# Patient Record
Sex: Female | Born: 1945 | Race: White | Hispanic: No | Smoking: Never smoker
Health system: Southern US, Community
[De-identification: ages and names within clinical notes are randomized; demographics above are authoritative.]

## PROBLEM LIST (undated history)

## (undated) DIAGNOSIS — G809 Cerebral palsy, unspecified: Secondary | ICD-10-CM

---

## 1997-08-28 ENCOUNTER — Other Ambulatory Visit: Admission: RE | Admit: 1997-08-28 | Discharge: 1997-08-28 | Payer: Self-pay | Admitting: Obstetrics and Gynecology

## 2001-03-10 ENCOUNTER — Other Ambulatory Visit: Admission: RE | Admit: 2001-03-10 | Discharge: 2001-03-10 | Payer: Self-pay | Admitting: Obstetrics and Gynecology

## 2004-01-21 ENCOUNTER — Other Ambulatory Visit: Admission: RE | Admit: 2004-01-21 | Discharge: 2004-01-21 | Payer: Self-pay | Admitting: Obstetrics and Gynecology

## 2004-01-24 ENCOUNTER — Ambulatory Visit (HOSPITAL_COMMUNITY): Admission: RE | Admit: 2004-01-24 | Discharge: 2004-01-24 | Payer: Self-pay | Admitting: Obstetrics and Gynecology

## 2004-04-09 ENCOUNTER — Ambulatory Visit (HOSPITAL_COMMUNITY): Admission: RE | Admit: 2004-04-09 | Discharge: 2004-04-09 | Payer: Self-pay | Admitting: Gastroenterology

## 2004-04-09 ENCOUNTER — Encounter (INDEPENDENT_AMBULATORY_CARE_PROVIDER_SITE_OTHER): Payer: Self-pay | Admitting: *Deleted

## 2005-02-04 ENCOUNTER — Ambulatory Visit (HOSPITAL_COMMUNITY): Admission: RE | Admit: 2005-02-04 | Discharge: 2005-02-04 | Payer: Self-pay | Admitting: Obstetrics and Gynecology

## 2007-11-23 ENCOUNTER — Ambulatory Visit (HOSPITAL_COMMUNITY): Admission: RE | Admit: 2007-11-23 | Discharge: 2007-11-23 | Payer: Self-pay | Admitting: Internal Medicine

## 2008-11-23 ENCOUNTER — Ambulatory Visit (HOSPITAL_COMMUNITY): Admission: RE | Admit: 2008-11-23 | Discharge: 2008-11-23 | Payer: Self-pay | Admitting: Internal Medicine

## 2010-10-17 NOTE — Op Note (Signed)
Abigail Thomas, BUFKIN              ACCOUNT NO.:  [id number]   MEDICAL RECORD NO.:  [id number]          PATIENT TYPE:  AMB   LOCATION:  ENDO                         FACILITY:  [facility]   PHYSICIAN:  Graylin Shiver, M.D.   DATE OF BIRTH:  08/17/1945   DATE OF PROCEDURE:  04/09/2004  DATE OF DISCHARGE:                                 OPERATIVE REPORT   PROCEDURE PERFORMED:  Colonoscopy with biopsy.   INDICATIONS FOR PROCEDURE:  Diarrhea.   Informed consent was obtained after explanation of the risks of bleeding,  infection, and perforation.   PREMEDICATIONS:  Fentanyl 100 mcg  IV, Versed 10 mg IV.   DESCRIPTION OF PROCEDURE:  With the patient in the left lateral decubitus  position, a rectal exam was performed and no masses were felt.  The Olympus  colonoscope was inserted into the rectum and advanced around the colon to  the cecum.  Cecal landmarks were identified.  The cecum and ascending colon  were normal.  Some random biopsies were obtained from the right colon to  look for any evidence of collagenous colitis.  The transverse colon normal.  The descending colon, sigmoid and rectum were normal.  The patient tolerated  the procedure well without complications.   IMPRESSION:  Normal colonoscopy to the cecum.   PLAN:  I would recommend that the patient take a fiber supplement such as  Metamucil.  The biopsies will be checked to look for any evidence of  collagenous colitis.       SFG/MEDQ  D:  04/09/2004  T:  04/09/2004  Job:  [id number]   cc:   Fleet Contras, M.D.  [address]  [place]  [place] [address]  Fax: [phone]

## 2013-09-24 ENCOUNTER — Emergency Department (INDEPENDENT_AMBULATORY_CARE_PROVIDER_SITE_OTHER)
Admission: EM | Admit: 2013-09-24 | Discharge: 2013-09-24 | Disposition: A | Discharge status: Home / Self Care | Payer: PRIVATE HEALTH INSURANCE | Source: Home / Self Care | Attending: Family Medicine | Admitting: Family Medicine

## 2013-09-24 ENCOUNTER — Encounter (HOSPITAL_COMMUNITY): Payer: Self-pay | Admitting: Emergency Medicine

## 2013-09-24 DIAGNOSIS — M5430 Sciatica, unspecified side: Secondary | ICD-10-CM

## 2013-09-24 DIAGNOSIS — R339 Retention of urine, unspecified: Secondary | ICD-10-CM

## 2013-09-24 LAB — POCT URINALYSIS DIP (DEVICE)
Bilirubin Urine: NEGATIVE
GLUCOSE, UA: NEGATIVE mg/dL
Hgb urine dipstick: NEGATIVE
KETONES UR: NEGATIVE mg/dL
LEUKOCYTES UA: NEGATIVE
NITRITE: NEGATIVE
PH: 7 (ref 5.0–8.0)
PROTEIN: NEGATIVE mg/dL
Specific Gravity, Urine: 1.015 (ref 1.005–1.030)
UROBILINOGEN UA: 0.2 mg/dL (ref 0.0–1.0)

## 2013-09-24 LAB — POCT I-STAT, CHEM 8
BUN: 30 mg/dL — AB (ref 6–23)
CALCIUM ION: 1.16 mmol/L (ref 1.13–1.30)
CREATININE: 0.7 mg/dL (ref 0.50–1.10)
Chloride: 103 mEq/L (ref 96–112)
GLUCOSE: 116 mg/dL — AB (ref 70–99)
HEMATOCRIT: 41 % (ref 36.0–46.0)
Hemoglobin: 13.9 g/dL (ref 12.0–15.0)
POTASSIUM: 3.9 meq/L (ref 3.7–5.3)
SODIUM: 140 meq/L (ref 137–147)
TCO2: 25 mmol/L (ref 0–100)

## 2013-09-24 LAB — CBC
HCT: 39.2 % (ref 36.0–46.0)
Hemoglobin: 13.4 g/dL (ref 12.0–15.0)
MCH: 32.1 pg (ref 26.0–34.0)
MCHC: 34.2 g/dL (ref 30.0–36.0)
MCV: 94 fL (ref 78.0–100.0)
PLATELETS: 182 10*3/uL (ref 150–400)
RBC: 4.17 MIL/uL (ref 3.87–5.11)
RDW: 12.9 % (ref 11.5–15.5)
WBC: 7.4 10*3/uL (ref 4.0–10.5)

## 2013-09-24 MED ORDER — PREDNISONE 5 MG PO KIT: PACK | ORAL | Status: DC

## 2013-09-24 MED ORDER — TRAMADOL HCL 50 MG PO TABS: 50.0000 mg | ORAL_TABLET | Freq: Four times a day (QID) | ORAL | Status: DC | PRN

## 2013-09-24 NOTE — Discharge Instructions (Signed)
Thank you for coming in today. Follow up with Dr. August Saucerean at [facility] for your leg and back soon.  Take prednisone daily.  Use tramadol for pain.  Come back or go to the emergency room if you notice new weakness new numbness problems walking or bowel or bladder problems.  Sciatica Sciatica is pain, weakness, numbness, or tingling along the path of the sciatic nerve. The nerve starts in the lower back and runs down the back of each leg. The nerve controls the muscles in the lower leg and in the back of the knee, while also providing sensation to the back of the thigh, lower leg, and the sole of your foot. Sciatica is a symptom of another medical condition. For instance, nerve damage or certain conditions, such as a herniated disk or bone spur on the spine, pinch or put pressure on the sciatic nerve. This causes the pain, weakness, or other sensations normally associated with sciatica. Generally, sciatica only affects one side of the body. CAUSES   Herniated or slipped disc.  Degenerative disk disease.  A pain disorder involving the narrow muscle in the buttocks (piriformis syndrome).  Pelvic injury or fracture.  Pregnancy.  Tumor (rare). SYMPTOMS  Symptoms can vary from mild to very severe. The symptoms usually travel from the low back to the buttocks and down the back of the leg. Symptoms can include:  Mild tingling or dull aches in the lower back, leg, or hip.  Numbness in the back of the calf or sole of the foot.  Burning sensations in the lower back, leg, or hip.  Sharp pains in the lower back, leg, or hip.  Leg weakness.  Severe back pain inhibiting movement. These symptoms may get worse with coughing, sneezing, laughing, or prolonged sitting or standing. Also, being overweight may worsen symptoms. DIAGNOSIS  Your caregiver will perform a physical exam to look for common symptoms of sciatica. He or she may ask you to do certain movements or activities that would  trigger sciatic nerve pain. Other tests may be performed to find the cause of the sciatica. These may include:  Blood tests.  X-rays.  Imaging tests, such as an MRI or CT scan. TREATMENT  Treatment is directed at the cause of the sciatic pain. Sometimes, treatment is not necessary and the pain and discomfort goes away on its own. If treatment is needed, your caregiver may suggest:  Over-the-counter medicines to relieve pain.  Prescription medicines, such as anti-inflammatory medicine, muscle relaxants, or narcotics.  Applying heat or ice to the painful area.  Steroid injections to lessen pain, irritation, and inflammation around the nerve.  Reducing activity during periods of pain.  Exercising and stretching to strengthen your abdomen and improve flexibility of your spine. Your caregiver may suggest losing weight if the extra weight makes the back pain worse.  Physical therapy.  Surgery to eliminate what is pressing or pinching the nerve, such as a bone spur or part of a herniated disk. HOME CARE INSTRUCTIONS   Only take over-the-counter or prescription medicines for pain or discomfort as directed by your caregiver.  Apply ice to the affected area for 20 minutes, 3 4 times a day for the first 48 72 hours. Then try heat in the same way.  Exercise, stretch, or perform your usual activities if these do not aggravate your pain.  Attend physical therapy sessions as directed by your caregiver.  Keep all follow-up appointments as directed by your caregiver.  Do not wear high heels  or shoes that do not provide proper support.  Check your mattress to see if it is too soft. A firm mattress may lessen your pain and discomfort. SEEK IMMEDIATE MEDICAL CARE IF:   You lose control of your bowel or bladder (incontinence).  You have increasing weakness in the lower back, pelvis, buttocks, or legs.  You have redness or swelling of your back.  You have a burning sensation when you  urinate.  You have pain that gets worse when you lie down or awakens you at night.  Your pain is worse than you have experienced in the past.  Your pain is lasting longer than 4 weeks.  You are suddenly losing weight without reason. MAKE SURE YOU:  Understand these instructions.  Will watch your condition.  Will get help right away if you are not doing well or get worse. Document Released: 05/12/2001 Document Revised: 11/17/2011 Document Reviewed: 09/27/2011 [facility] Patient Information 2014 [place], [place].  Acute Urinary Retention Acute urinary retention is the temporary inability to urinate. This is an uncommon problem in women. It can be caused by:  Infection.  A side effect of a medicine.  A problem in a nearby organ that presses or squeezes on the bladder or the urethra (the tube that drains the bladder).  Psychological problems.   Surgery on your bladder, urethra, or pelvic organs that causes obstruction to the outflow of urine from your bladder. HOME CARE INSTRUCTIONS  If you are sent home with a Foley catheter and a drainage system, you will need to discuss the best course of action with your health care provider. While the catheter is in, maintain a good intake of fluids. Keep the drainage bag emptied and lower than your catheter. This is so that contaminated urine will not flow back into your bladder, which could lead to a urinary tract infection. There are two main types of drainage bags. One is a large bag that usually is used at night. It has a good capacity that will allow you to sleep through the night without having to empty it. The second type is called a leg bag. It has a smaller capacity so it needs to be emptied more frequently. However, the main advantage is that it can be attached by a leg strap and goes underneath your clothing, allowing you the freedom to move about or leave your home. Only take over-the-counter or prescription medicines for pain,  discomfort, or fever as directed by your health care provider.  SEEK MEDICAL CARE IF:  You develop a low-grade fever.  You experience spasms or leakage of urine with the spasms. SEEK IMMEDIATE MEDICAL CARE IF:   You develop chills or fever.  Your catheter stops draining urine.  Your catheter falls out.  You start to develop increased bleeding that does not respond to rest and increased fluid intake. MAKE SURE YOU:  Understand these instructions.  Will watch your condition.  Will get help right away if you are not doing well or get worse. Document Released: 05/17/2006 Document Revised: 03/08/2013 Document Reviewed: 10/27/2012 [facility] Patient Information 2014 [place], [place].

## 2013-09-24 NOTE — ED Provider Notes (Signed)
Abigail Thomas is a 68 y.o. female who presents to Urgent Care today for right back pain radiating to the right leg. The pain also radiates to the suprapubic area. Symptoms have been present now for one month. She denies any new weakness or numbness. She does note some difficulty urinating over the past month. She denies any fevers or chills nausea vomiting or diarrhea. She's tried Aleve which helps some. She has not seen a doctor for this problem yet. She denies any injury. She is a pertinent medical history for cerebral palsy results and lower extremity weakness bilaterally.   History reviewed. No pertinent past medical history. History  Substance Use Topics  . Smoking status: Not on file  . Smokeless tobacco: Not on file  . Alcohol Use: Not on file   ROS as above Medications: No current facility-administered medications for this encounter.   No current outpatient prescriptions on file.    Exam:  BP 147/84  Pulse 69  Temp(Src) 98 F (36.7 C) (Oral)  Resp 18  SpO2 100% Gen: Well NAD HEENT: EOMI,  MMM Lungs: Normal work of breathing. CTABL Heart: RRR no MRG Abd: NABS, Soft. NT, ND Exts: Brisk capillary refill, warm and well perfused.  Back: Nontender to spinal midline. Tender palpation right lumbar paraspinal area. Positive right straight leg raise test Neuro: Alert and oriented. Sensation is intact. Hyperreflexia bilateral knees. Unable to assess ankles. Lower extremity strength is decreased bilaterally however this is not new. Skin: No bedsores  A Foley catheter was placed. 300 mL of urine was drained. Patient felt mildly improved.  Results for orders placed during the hospital encounter of 09/24/13 (from the past 24 hour(s))  CBC     Status: None   Collection Time    09/24/13 12:09 PM      Result Value Ref Range   WBC 7.4  4.0 - 10.5 K/uL   RBC 4.17  3.87 - 5.11 MIL/uL   Hemoglobin 13.4  12.0 - 15.0 g/dL   HCT 16.139.2  09.636.0 - 04.546.0 %   MCV 94.0  78.0 - 100.0 fL   MCH 32.1   26.0 - 34.0 pg   MCHC 34.2  30.0 - 36.0 g/dL   RDW 40.912.9  81.111.5 - 91.415.5 %   Platelets 182  150 - 400 K/uL  POCT URINALYSIS DIP (DEVICE)     Status: None   Collection Time    09/24/13 12:09 PM      Result Value Ref Range   Glucose, UA NEGATIVE  NEGATIVE mg/dL   Bilirubin Urine NEGATIVE  NEGATIVE   Ketones, ur NEGATIVE  NEGATIVE mg/dL   Specific Gravity, Urine 1.015  1.005 - 1.030   Hgb urine dipstick NEGATIVE  NEGATIVE   pH 7.0  5.0 - 8.0   Protein, ur NEGATIVE  NEGATIVE mg/dL   Urobilinogen, UA 0.2  0.0 - 1.0 mg/dL   Nitrite NEGATIVE  NEGATIVE   Leukocytes, UA NEGATIVE  NEGATIVE  POCT I-STAT, CHEM 8     Status: Abnormal   Collection Time    09/24/13 12:27 PM      Result Value Ref Range   Sodium 140  137 - 147 mEq/L   Potassium 3.9  3.7 - 5.3 mEq/L   Chloride 103  96 - 112 mEq/L   BUN 30 (*) 6 - 23 mg/dL   Creatinine, Ser 7.820.70  0.50 - 1.10 mg/dL   Glucose, Bld 956116 (*) 70 - 99 mg/dL   Calcium, Ion 2.131.16  0.861.13 -  1.30 mmol/L   TCO2 25  0 - 100 mmol/L   Hemoglobin 13.9  12.0 - 15.0 g/dL   HCT 40.941.0  81.136.0 - 91.446.0 %   No results found.  Assessment and Plan: 68 y.o. female with right leg pain associated with urinary retention.  I am concerned for lumbar radiculopathy is a major component of her symptoms. Her neurologic exam is somewhat difficult given her history of cerebral palsy. She does not have significant urinary retention currently. His symptoms have been ongoing now for one month. Plan to use prednisone Dosepak to combat sciatica symptoms. Additionally his tramadol for pain control. Followup with orthopedics soon for evaluation and management.  Discussed warning signs or symptoms. Please see discharge instructions. Patient expresses understanding.    Rodolph BongEvan S Hoang Pettingill, MD 09/24/13 [phone]

## 2013-09-24 NOTE — ED Notes (Signed)
Patient states has been having right sided flank pain Pain shoots down her right leg Not able to get urine spicimen

## 2013-09-25 LAB — URINE CULTURE
CULTURE: NO GROWTH
Colony Count: NO GROWTH

## 2014-01-18 ENCOUNTER — Encounter (HOSPITAL_COMMUNITY): Payer: Self-pay | Admitting: Emergency Medicine

## 2014-01-18 ENCOUNTER — Emergency Department (HOSPITAL_COMMUNITY)
Admission: EM | Admit: 2014-01-18 | Discharge: 2014-01-18 | Discharge status: Against Medical Advice | Payer: PRIVATE HEALTH INSURANCE | Attending: Emergency Medicine | Admitting: Emergency Medicine

## 2014-01-18 ENCOUNTER — Emergency Department (INDEPENDENT_AMBULATORY_CARE_PROVIDER_SITE_OTHER)
Admission: EM | Admit: 2014-01-18 | Discharge: 2014-01-18 | Disposition: A | Discharge status: Nursing Home (Includes ICF) | Payer: PRIVATE HEALTH INSURANCE | Source: Home / Self Care | Attending: Emergency Medicine | Admitting: Emergency Medicine

## 2014-01-18 DIAGNOSIS — W19XXXA Unspecified fall, initial encounter: Secondary | ICD-10-CM | POA: Diagnosis not present

## 2014-01-18 DIAGNOSIS — S298XXA Other specified injuries of thorax, initial encounter: Secondary | ICD-10-CM | POA: Insufficient documentation

## 2014-01-18 DIAGNOSIS — S99929A Unspecified injury of unspecified foot, initial encounter: Secondary | ICD-10-CM

## 2014-01-18 DIAGNOSIS — Y92009 Unspecified place in unspecified non-institutional (private) residence as the place of occurrence of the external cause: Secondary | ICD-10-CM

## 2014-01-18 DIAGNOSIS — S0990XA Unspecified injury of head, initial encounter: Secondary | ICD-10-CM | POA: Diagnosis present

## 2014-01-18 DIAGNOSIS — S060X0A Concussion without loss of consciousness, initial encounter: Secondary | ICD-10-CM

## 2014-01-18 DIAGNOSIS — Y929 Unspecified place or not applicable: Secondary | ICD-10-CM | POA: Diagnosis not present

## 2014-01-18 DIAGNOSIS — S0993XA Unspecified injury of face, initial encounter: Secondary | ICD-10-CM | POA: Diagnosis not present

## 2014-01-18 DIAGNOSIS — Y939 Activity, unspecified: Secondary | ICD-10-CM | POA: Insufficient documentation

## 2014-01-18 DIAGNOSIS — W010XXA Fall on same level from slipping, tripping and stumbling without subsequent striking against object, initial encounter: Secondary | ICD-10-CM

## 2014-01-18 DIAGNOSIS — S8990XA Unspecified injury of unspecified lower leg, initial encounter: Secondary | ICD-10-CM | POA: Insufficient documentation

## 2014-01-18 DIAGNOSIS — S99919A Unspecified injury of unspecified ankle, initial encounter: Secondary | ICD-10-CM

## 2014-01-18 DIAGNOSIS — T07XXXA Unspecified multiple injuries, initial encounter: Secondary | ICD-10-CM

## 2014-01-18 DIAGNOSIS — S199XXA Unspecified injury of neck, initial encounter: Secondary | ICD-10-CM

## 2014-01-18 HISTORY — DX: Cerebral palsy, unspecified: G80.9

## 2014-01-18 NOTE — ED Provider Notes (Signed)
Chief Complaint   Chief Complaint  Patient presents with  . Fall    History of Present Illness   Abigail Thomas is a 68-year-old female with cerebral palsy who fell last night at home. She gotten up to the bathroom when she tripped over some braces that she has on her legs. She ended up on the floor. Her son was in the next room and he heard the fall and came running right away. There was no apparent loss of consciousness. She was able to get back up and then went back to bed. This morning she has no memory of the fall and she's been confused and disoriented. She denies any headache or facial pain. She hasn't had any neck pain. She has bruises on her arms, legs, and back as well as a large bruise on her occipital area. These are minimally tender to palpation. She's had no nausea or vomiting. No focal weakness or paresthesias. No difficulty with speech or ambulation. She's able to move all her extremities well. She denies any pain in her chest, abdomen, or back.  Review of Systems   Other than as noted above, the patient denies any of the following symptoms: ENT:  No headache, facial pain, or bleeding from the nose or ears.  No loose or broken teeth. Neck:  No neck pain or stiffnes. Cardiac:  No chest pain. No palpitations, dizziness, syncope or fainting. GI:  No abdominal pain. No nausea, vomiting, or diarrhea. M-S:  No extremity pain, swelling, bruising, limited ROM, or back pain. Neuro:  No loss of consciousness, seizure activity, dizziness, vertigo, paresthesias, numbness, or weakness.  No difficulty with speech or ambulation.  PMFSH   Past medical history, family history, social history, meds, and allergies were reviewed.    Physical Examination    Vital signs:  BP 120/93  Pulse 86  Temp(Src) 97.6 F (36.4 C) (Oral)  Resp 18  SpO2 97% General:  Alert, oriented and in no distress. Eye:  PERRL, full EOMs. ENT:  There is a large bruise on the occipital area. This is mildly  tender. Neck:  No tenderness to palpation.  Full ROM without pain. Heart:  Regular rhythm.  No extrasystoles, gallops, or murmers. Lungs:  No chest wall tenderness to palpation. Breath sounds clear and equal bilaterally.  No wheezes, rales or rhonchi. Abdomen:  Non tender. Back:  Non tender to palpation.  Full ROM without pain. Extremities:  No tenderness, swelling, or deformity.  Full ROM of all joints without pain.  Pulses full.  Brisk capillary refill. Neuro:  Alert and oriented times 3.  Cranial nerves intact.  No muscle weakness.  Sensation intact to light touch.  Gait normal. Skin:  She has extensive bruises on her arms, legs, and back, no abrasions or lacerations.  Assessment   The primary encounter diagnosis was Concussion, without loss of consciousness, initial encounter. Diagnoses of Multiple bruises, Fall at home, initial encounter, and Place of occurrence, home were also pertinent to this visit.  Given her 68 years old and her history of what appears to be concussion, I think it's best that she get imaging to rule out any intracranial bleed.  Plan   The patient was transferred to the ED via shuttle in stable condition.  Medical Decision Making:  68 year old with CP fell last night getting up to go to bathroom.  She hit her head and has multiple bruises all over.  Son states she been confused and disoriented since fall and does not have  good memory of event.  No LOC.  On exam today she is alert and oriented times 3.  She has a large tender bruise in her occipital area and multiple other bruises on arms, legs, and back.  No neuro signs or symptoms.  Given her age and symptoms of concussion I feel she needs a cranial CT.      Reuben Likes, MD 01/18/14 [phone]

## 2014-01-18 NOTE — ED Notes (Signed)
[facility]. Pt has head pain, neck pain and knot to head. sts was confused shortly after fall but clear now. sts also some leg pain. Pt has CP and was tangled up in bed sheets when she fell.

## 2014-01-18 NOTE — Discharge Instructions (Signed)
We have determined that your problem requires further evaluation in the emergency department.  We will take care of your transport there.  Once at the emergency department, you will be evaluated by a provider and they will order whatever treatment or tests they deem necessary.  We cannot guarantee that they will do any specific test or do any specific treatment.    Concussion A concussion is a brain injury. It is caused by:  A hit to the head.  A quick and sudden movement (jolt) of the head or neck. A concussion is usually not life threatening. Even so, it can cause serious problems. If you had a concussion before, you may have concussion-like problems after a hit to your head. HOME CARE General Instructions  Follow your doctor's directions carefully.  Take medicines only as told by your doctor.  Only take medicines your doctor says are safe.  Do not drink alcohol until your doctor says it is okay. Alcohol and some drugs can slow down healing. They can also put you at risk for further injury.  If you are having trouble remembering things, write them down.  Try to do one thing at a time if you get distracted easily. For example, do not watch TV while making dinner.  Talk to your family members or close friends when making important decisions.  Follow up with your doctor as told.  Watch your symptoms. Tell others to do the same. Serious problems can sometimes happen after a concussion. Older adults are more likely to have these problems.  Tell your teachers, school nurse, school counselor, coach, Event organiserathletic trainer, or work Production designer, theatre/television/filmmanager about your concussion. Tell them about what you can or cannot do. They should watch to see if:  It gets even harder for you to pay attention or concentrate.  It gets even harder for you to remember things or learn new things.  You need more time than normal to finish things.  You become annoyed (irritable) more than before.  You are not able to deal  with stress as well.  You have more problems than before.  Rest. Make sure you:  Get plenty of sleep at night.  Go to sleep early.  Go to bed at the same time every day. Try to wake up at the same time.  Rest during the day.  Take naps when you feel tired.  Limit activities where you have to think a lot or concentrate. These include:  Doing homework.  Doing work related to a job.  Watching TV.  Using the computer. Returning To Your Regular Activities Return to your normal activities slowly, not all at once. You must give your body and brain enough time to heal.   Do not play sports or do other athletic activities until your doctor says it is okay.  Ask your doctor when you can drive, ride a bicycle, or work other vehicles or machines. Never do these things if you feel dizzy.  Ask your doctor about when you can return to work or school. Preventing Another Concussion It is very important to avoid another brain injury, especially before you have healed. In rare cases, another injury can lead to permanent brain damage, brain swelling, or death. The risk of this is greatest during the first 7-10 days after your injury. Avoid injuries by:   Wearing a seat belt when riding in a car.  Not drinking too much alcohol.  Avoiding activities that could lead to a second concussion (such as contact sports).  Wearing a helmet when doing activities like:  Biking.  Skiing.  Skateboarding.  Skating.  Making your home safer by:  Removing things from the floor or stairways that could make you trip.  Using grab bars in bathrooms and handrails by stairs.  Placing non-slip mats on floors and in bathtubs.  Improve lighting in dark areas. GET HELP IF:  It gets even harder for you to pay attention or concentrate.  It gets even harder for you to remember things or learn new things.  You need more time than normal to finish things.  You become annoyed (irritable) more than  before.  You are not able to deal with stress as well.  You have more problems than before.  You have problems keeping your balance.  You are not able to react quickly when you should. Get help if you have any of these problems for more than 2 weeks:   Lasting (chronic) headaches.  Dizziness or trouble balancing.  Feeling sick to your stomach (nausea).  Seeing (vision) problems.  Being affected by noises or light more than normal.  Feeling sad, low, down in the dumps, blue, gloomy, or empty (depressed).  Mood changes (mood swings).  Feeling of fear or nervousness about what may happen (anxiety).  Feeling annoyed.  Memory problems.  Problems concentrating or paying attention.  Sleep problems.  Feeling tired all the time. GET HELP RIGHT AWAY IF:   You have bad headaches or your headaches get worse.  You have weakness (even if it is in one hand, leg, or part of the face).  You have loss of feeling (numbness).  You feel off balance.  You keep throwing up (vomiting).  You feel tired.  One black center of your eye (pupil) is larger than the other.  You twitch or shake violently (convulse).  Your speech is not clear (slurred).  You are more confused, easily angered (agitated), or annoyed than before.  You have more trouble resting than before.  You are unable to recognize people or places.  You have neck pain.  It is difficult to wake you up.  You have unusual behavior changes.  You pass out (lose consciousness). MAKE SURE YOU:   Understand these instructions.  Will watch your condition.  Will get help right away if you are not doing well or get worse. Document Released: 05/06/2009 Document Revised: 10/02/2013 Document Reviewed: 12/08/2012 [facility] Patient Information 2015 [place], [place]. This information is not intended to replace advice given to you by your health care provider. Make sure you discuss any questions you have with your health care  provider.

## 2014-01-18 NOTE — ED Notes (Signed)
Reports tripping and falling around 4 a.m this morning.  Pt is c/o  Pain in right knee that radiates up to hip.  Swelling in left foot.  Knot on back of head from hitting head.  Pt's  Son states "unsure if loss of consciousness states pt does not remember falling".    Son states "she seems disoriented".

## 2015-03-15 ENCOUNTER — Encounter (HOSPITAL_COMMUNITY): Payer: Self-pay | Admitting: Emergency Medicine

## 2015-03-15 ENCOUNTER — Inpatient Hospital Stay (HOSPITAL_COMMUNITY)
Admission: EM | Admit: 2015-03-15 | Discharge: 2015-03-18 | DRG: 556 | Disposition: A | Discharge status: Home / Self Care | Payer: Medicare Other | Attending: Family Medicine | Admitting: Family Medicine

## 2015-03-15 ENCOUNTER — Emergency Department (HOSPITAL_COMMUNITY): Payer: Medicare Other

## 2015-03-15 DIAGNOSIS — W19XXXA Unspecified fall, initial encounter: Secondary | ICD-10-CM | POA: Diagnosis present

## 2015-03-15 DIAGNOSIS — M25562 Pain in left knee: Secondary | ICD-10-CM | POA: Diagnosis present

## 2015-03-15 DIAGNOSIS — R531 Weakness: Secondary | ICD-10-CM | POA: Diagnosis not present

## 2015-03-15 DIAGNOSIS — M1711 Unilateral primary osteoarthritis, right knee: Secondary | ICD-10-CM | POA: Diagnosis not present

## 2015-03-15 DIAGNOSIS — E46 Unspecified protein-calorie malnutrition: Secondary | ICD-10-CM | POA: Diagnosis present

## 2015-03-15 DIAGNOSIS — Z23 Encounter for immunization: Secondary | ICD-10-CM | POA: Diagnosis not present

## 2015-03-15 DIAGNOSIS — R51 Headache: Secondary | ICD-10-CM | POA: Diagnosis present

## 2015-03-15 DIAGNOSIS — M25561 Pain in right knee: Secondary | ICD-10-CM | POA: Diagnosis not present

## 2015-03-15 DIAGNOSIS — M25569 Pain in unspecified knee: Secondary | ICD-10-CM | POA: Insufficient documentation

## 2015-03-15 DIAGNOSIS — N12 Tubulo-interstitial nephritis, not specified as acute or chronic: Secondary | ICD-10-CM | POA: Diagnosis present

## 2015-03-15 DIAGNOSIS — N390 Urinary tract infection, site not specified: Secondary | ICD-10-CM | POA: Diagnosis not present

## 2015-03-15 DIAGNOSIS — R519 Headache, unspecified: Secondary | ICD-10-CM

## 2015-03-15 DIAGNOSIS — I6789 Other cerebrovascular disease: Secondary | ICD-10-CM | POA: Diagnosis not present

## 2015-03-15 DIAGNOSIS — Q660 Congenital talipes equinovarus: Secondary | ICD-10-CM

## 2015-03-15 DIAGNOSIS — R200 Anesthesia of skin: Secondary | ICD-10-CM | POA: Diagnosis not present

## 2015-03-15 DIAGNOSIS — M1712 Unilateral primary osteoarthritis, left knee: Secondary | ICD-10-CM | POA: Diagnosis not present

## 2015-03-15 DIAGNOSIS — G809 Cerebral palsy, unspecified: Secondary | ICD-10-CM | POA: Diagnosis present

## 2015-03-15 DIAGNOSIS — B888 Other specified infestations: Secondary | ICD-10-CM | POA: Diagnosis not present

## 2015-03-15 DIAGNOSIS — Y92009 Unspecified place in unspecified non-institutional (private) residence as the place of occurrence of the external cause: Secondary | ICD-10-CM

## 2015-03-15 DIAGNOSIS — M542 Cervicalgia: Secondary | ICD-10-CM | POA: Diagnosis not present

## 2015-03-15 LAB — I-STAT CHEM 8, ED
BUN: 35 mg/dL — ABNORMAL HIGH (ref 6–20)
CHLORIDE: 105 mmol/L (ref 101–111)
CREATININE: 0.8 mg/dL (ref 0.44–1.00)
Calcium, Ion: 1.17 mmol/L (ref 1.13–1.30)
Glucose, Bld: 136 mg/dL — ABNORMAL HIGH (ref 65–99)
HEMATOCRIT: 43 % (ref 36.0–46.0)
HEMOGLOBIN: 14.6 g/dL (ref 12.0–15.0)
POTASSIUM: 4.3 mmol/L (ref 3.5–5.1)
Sodium: 140 mmol/L (ref 135–145)
TCO2: 25 mmol/L (ref 0–100)

## 2015-03-15 LAB — URINALYSIS, ROUTINE W REFLEX MICROSCOPIC
GLUCOSE, UA: NEGATIVE mg/dL
KETONES UR: NEGATIVE mg/dL
Nitrite: POSITIVE — AB
PH: 5 (ref 5.0–8.0)
Protein, ur: NEGATIVE mg/dL
Specific Gravity, Urine: 1.026 (ref 1.005–1.030)
Urobilinogen, UA: 0.2 mg/dL (ref 0.0–1.0)

## 2015-03-15 LAB — DIFFERENTIAL
BASOS ABS: 0 10*3/uL (ref 0.0–0.1)
BASOS PCT: 1 %
EOS ABS: 0.2 10*3/uL (ref 0.0–0.7)
Eosinophils Relative: 3 %
LYMPHS ABS: 1.2 10*3/uL (ref 0.7–4.0)
Lymphocytes Relative: 16 %
Monocytes Absolute: 0.5 10*3/uL (ref 0.1–1.0)
Monocytes Relative: 7 %
NEUTROS ABS: 5.6 10*3/uL (ref 1.7–7.7)
NEUTROS PCT: 73 %

## 2015-03-15 LAB — APTT: APTT: 33 s (ref 24–37)

## 2015-03-15 LAB — COMPREHENSIVE METABOLIC PANEL
ALBUMIN: 3.7 g/dL (ref 3.5–5.0)
ALT: 15 U/L (ref 14–54)
AST: 24 U/L (ref 15–41)
Alkaline Phosphatase: 73 U/L (ref 38–126)
Anion gap: 10 (ref 5–15)
BUN: 29 mg/dL — AB (ref 6–20)
CHLORIDE: 105 mmol/L (ref 101–111)
CO2: 26 mmol/L (ref 22–32)
Calcium: 9.2 mg/dL (ref 8.9–10.3)
Creatinine, Ser: 0.78 mg/dL (ref 0.44–1.00)
GFR calc Af Amer: >60 mL/min (ref >60)
GFR calc non Af Amer: >60 mL/min (ref >60)
GLUCOSE: 140 mg/dL — AB (ref 65–99)
POTASSIUM: 4.3 mmol/L (ref 3.5–5.1)
Sodium: 141 mmol/L (ref 135–145)
Total Bilirubin: 0.8 mg/dL (ref 0.3–1.2)
Total Protein: 6.1 g/dL — ABNORMAL LOW (ref 6.5–8.1)

## 2015-03-15 LAB — CBC
HCT: 40.4 % (ref 36.0–46.0)
HEMOGLOBIN: 13.5 g/dL (ref 12.0–15.0)
MCH: 31.3 pg (ref 26.0–34.0)
MCHC: 33.4 g/dL (ref 30.0–36.0)
MCV: 93.5 fL (ref 78.0–100.0)
Platelets: 208 10*3/uL (ref 150–400)
RBC: 4.32 MIL/uL (ref 3.87–5.11)
RDW: 12.6 % (ref 11.5–15.5)
WBC: 7.7 10*3/uL (ref 4.0–10.5)

## 2015-03-15 LAB — URINE MICROSCOPIC-ADD ON

## 2015-03-15 LAB — RAPID URINE DRUG SCREEN, HOSP PERFORMED
AMPHETAMINES: NOT DETECTED
BARBITURATES: NOT DETECTED
BENZODIAZEPINES: NOT DETECTED
COCAINE: NOT DETECTED
Opiates: NOT DETECTED
TETRAHYDROCANNABINOL: NOT DETECTED

## 2015-03-15 LAB — PROTIME-INR
INR: 1.07 (ref 0.00–1.49)
Prothrombin Time: 14.1 seconds (ref 11.6–15.2)

## 2015-03-15 LAB — I-STAT TROPONIN, ED: Troponin i, poc: 0.01 ng/mL (ref 0.00–0.08)

## 2015-03-15 LAB — CBG MONITORING, ED: Glucose-Capillary: 140 mg/dL — ABNORMAL HIGH (ref 65–99)

## 2015-03-15 LAB — ETHANOL: Alcohol, Ethyl (B): <5 mg/dL (ref <5)

## 2015-03-15 MED ORDER — SODIUM CHLORIDE 0.9 % IJ SOLN
3.0000 mL | Freq: Two times a day (BID) | INTRAMUSCULAR | Status: DC
  Administered 2015-03-16 – 2015-03-18 (×3): 3 mL via INTRAVENOUS

## 2015-03-15 MED ORDER — DIPHENHYDRAMINE HCL 50 MG/ML IJ SOLN
25.0000 mg | Freq: Once | INTRAMUSCULAR | Status: AC
  Administered 2015-03-15: 25 mg via INTRAVENOUS
  Filled 2015-03-15: qty 1

## 2015-03-15 MED ORDER — ENOXAPARIN SODIUM 40 MG/0.4ML ~~LOC~~ SOLN
40.0000 mg | Freq: Every day | SUBCUTANEOUS | Status: DC
  Administered 2015-03-16 – 2015-03-18 (×3): 40 mg via SUBCUTANEOUS
  Filled 2015-03-15 (×2): qty 0.4

## 2015-03-15 MED ORDER — DEXTROSE 5 % IV SOLN
1.0000 g | Freq: Once | INTRAVENOUS | Status: AC
  Administered 2015-03-15: 1 g via INTRAVENOUS
  Filled 2015-03-15: qty 10

## 2015-03-15 MED ORDER — SODIUM CHLORIDE 0.9 % IV SOLN
INTRAVENOUS | Status: DC
  Administered 2015-03-16: via INTRAVENOUS

## 2015-03-15 MED ORDER — METOCLOPRAMIDE HCL 5 MG/ML IJ SOLN
10.0000 mg | Freq: Once | INTRAMUSCULAR | Status: AC
  Administered 2015-03-15: 10 mg via INTRAVENOUS
  Filled 2015-03-15: qty 2

## 2015-03-15 NOTE — ED Notes (Signed)
Patient transported to CT 

## 2015-03-15 NOTE — ED Notes (Signed)
[facility] called @ 2122

## 2015-03-15 NOTE — H&P (Signed)
[facility] History and PhysicalBarbarajean Kinzlerelton ZOX:[id number] DOB: 06-15-1945 DOA: 03/15/2015  Referring physician: ED physician PCP: No PCP Per Patient   Chief Complaint: Legs gave out and fall  HPI:  [name] is a 69yo woman with PMH of only cerebral palsy who presents for a fall at home.  [name] is somewhat confused on my exam and not able to give a full history.  Family is not present.  She reports not remembering what happened.  By chart review, patient apparently had an episode of weakness and fell when she was trying to go to the bathroom.  She caught herself and did not hit her head or lose consciousness.  Her only symptom is headache for the past week which is in her neck/back of the head.  She reports no recent illness.  She reports being able to walk with a walker.  She has no urinary symptoms at this time and no tenderness to palpation.  She has no focal weakness or new neurological changes.  Her fall, based on her recollection of the event (which was not much) and what was reported to the ED physician makes it appear that this was a mechanical fall.  In the ED, she had dark urine which was also nitrite positive, so she was started on therapy for a UTI.  I attempted to call family twice without success.  She does remember falling on her knees and having knee pain.   Of note, Ms. Hefter does not see a doctor regularly and is on no medications regularly.  She was also noted to have bedbugs by nursing.    Assessment and Plan: Fall - I am currently unclear what illicited this fall.  From discussion with ED, appeared to be mechanical.  However, she is confused and may have an infection - Monitor on telemetry overnight - Trend TnI  - AM EKG - IVF at 75cc/hr of NS overnight - PT/OT in the morning  Knee pain, bilateral - She landed on knees and had some pain to palpation, particularly on the right.  - Xrays of bilateral knees - Pain control can be given PRN if needed     Urinary tract Infection - I am concerned, given her confusion and fall, she may have a true infection - She was given Rocephin in the ED - UC ordered - Day team can consider whether giving her a 3 day course of Rocephin would be appropriate.     Infestation by bed bug - Contact precautions   Headache - Will try migraine cocktail, reglan, benadryl, toradol - to see if this helps - CT head and neck without acute findings.   Diet: Heart healthy  DVT PPx: lovenox   Radiological Exams on Admission: Ct Head Wo Contrast  03/15/2015  CLINICAL DATA:  SOMETHING FELL OFF A SHELF AND HIT HER HARD IN THE HEADNOW HAS SEVERE PAIN BASE OF SKULL AND ENTIRE NECK EXAM: CT HEAD WITHOUT CONTRAST CT CERVICAL SPINE WITHOUT CONTRAST TECHNIQUE: Multidetector CT imaging of the head and cervical spine was performed following the standard protocol without intravenous contrast. Multiplanar CT image reconstructions of the cervical spine were also generated. COMPARISON:  None. FINDINGS: CT HEAD FINDINGS Ventricles are normal configuration. There is ventricular and sulcal enlargement reflecting mild diffuse atrophy. No hydrocephalus. There are no parenchymal masses or mass effect. There is no evidence of a cortical infarct. Periventricular white matter hypoattenuation is noted consistent with mild chronic microvascular ischemic change. There are no extra-axial masses  or abnormal fluid collections. There is no intracranial hemorrhage. No skull fracture. Visualized sinuses and mastoid air cells are clear. CT CERVICAL SPINE FINDINGS No fracture. No spondylolisthesis. There is mild loss of disc height at C3-C4 with moderate loss disc height at C4-C5. Mild loss disc height is noted at C5-C6 and C6-C7. Uncovertebral spurring leads to moderate neural foraminal narrowing on the left at C3-C4-C4-C5. Facet degenerative changes are noted most evident at C7-T1. Bones are demineralized. Soft tissues are unremarkable.  Lung apices are  clear. IMPRESSION: HEAD CT: No acute intracranial abnormalities. Mild atrophy and chronic microvascular ischemic change. CERVICAL CT:  No fracture or acute finding. Electronically Signed   By: Amie Portlandavid  Ormond M.D.   On: 03/15/2015 20:21   Ct Cervical Spine Wo Contrast  03/15/2015  CLINICAL DATA:  SOMETHING FELL OFF A SHELF AND HIT HER HARD IN THE HEADNOW HAS SEVERE PAIN BASE OF SKULL AND ENTIRE NECK EXAM: CT HEAD WITHOUT CONTRAST CT CERVICAL SPINE WITHOUT CONTRAST TECHNIQUE: Multidetector CT imaging of the head and cervical spine was performed following the standard protocol without intravenous contrast. Multiplanar CT image reconstructions of the cervical spine were also generated. COMPARISON:  None. FINDINGS: CT HEAD FINDINGS Ventricles are normal configuration. There is ventricular and sulcal enlargement reflecting mild diffuse atrophy. No hydrocephalus. There are no parenchymal masses or mass effect. There is no evidence of a cortical infarct. Periventricular white matter hypoattenuation is noted consistent with mild chronic microvascular ischemic change. There are no extra-axial masses or abnormal fluid collections. There is no intracranial hemorrhage. No skull fracture. Visualized sinuses and mastoid air cells are clear. CT CERVICAL SPINE FINDINGS No fracture. No spondylolisthesis. There is mild loss of disc height at C3-C4 with moderate loss disc height at C4-C5. Mild loss disc height is noted at C5-C6 and C6-C7. Uncovertebral spurring leads to moderate neural foraminal narrowing on the left at C3-C4-C4-C5. Facet degenerative changes are noted most evident at C7-T1. Bones are demineralized. Soft tissues are unremarkable.  Lung apices are clear. IMPRESSION: HEAD CT: No acute intracranial abnormalities. Mild atrophy and chronic microvascular ischemic change. CERVICAL CT:  No fracture or acute finding. Electronically Signed   By: Amie Portlandavid  Ormond M.D.   On: 03/15/2015 20:21   Dg Pelvis Portable  03/15/2015   CLINICAL DATA:  Leg weakness while trying to get to the bathroom. EXAM: PORTABLE PELVIS 1-2 VIEWS COMPARISON:  None. FINDINGS: Degenerative changes in the lower lumbar spine and in both hips. No evidence of acute fracture or dislocation of the pelvis. SI joints and symphysis pubis are not displaced. Sacral struts are symmetrical. No focal bone lesion or bone destruction. Calcified phleboliths in the pelvis. IMPRESSION: No acute bony abnormalities. Electronically Signed   By: Burman NievesWilliam  Stevens M.D.   On: 03/15/2015 21:20   Code Status: Full Family Communication: Pt at bedside Disposition Plan: Admit for further evaluation    Debe CoderMULLEN, Mathew Postiglione, MD [phone]   Review of Systems:  Ms. Renae GlossShelton reported no for everything I asked, I am not sure how clear of a historian she was.  Constitutional: Negative for fever, chills and malaise/fatigue.  HENT: Negative for hearing loss, ear pain, nosebleeds, congestion Eyes: Negative for blurred vision, double vision Respiratory: Negative for cough, hemoptysis, sputum production, shortness of breath Cardiovascular: Negative for chest pain, palpitations, orthopnea Gastrointestinal: Negative for nausea, vomiting and abdominal pain Genitourinary: Negative for dysuria, urgency, frequency, hematuria Musculoskeletal: + for fall, hip pain, knee pain Negative for myalgias, back pain  Skin: + for rash over legs and  arms. Negative for itching and rash.  Neurological: Negative for dizziness and weakness Psychiatric/Behavioral: The patient is not nervous/anxious.      Past Medical History  Diagnosis Date  . CP (cerebral palsy) (HCC)     She reports no surgical history  Social History:  reports that she has never smoked. She does not have any smokeless tobacco history on file. She reports that she does not drink alcohol or use illicit drugs.  No Known Allergies  I attempted to illicit a family history, but she reported not being able to remember any history.   Prior  to Admission medications   Medication Sig Start Date End Date Taking? Authorizing Provider  aspirin-acetaminophen-caffeine (EXCEDRIN MIGRAINE) [id number] MG tablet Take 1 tablet by mouth every 6 (six) hours as needed for headache.   Yes Historical Provider, MD    Physical Exam: Filed Vitals:   03/15/15 2100 03/15/15 2130 03/15/15 2200 03/16/15 0020  BP: 127/62 128/100 127/63 123/66  Pulse: 85 89 87 75  Temp:    98.3 F (36.8 C)  TempSrc:    Oral  Resp: SpO2: 96% 98% 97% 98%    Physical Exam  Constitutional: Elderly woman, somewhat confused, oriented to person, place HENT: Normocephalic. Oropharynx is clear and moist.  Eyes: Conjunctivae are normal. PERRLA, no scleral icterus.  Neck: Normal ROM. Neck supple, no neck tenderness CVS: RR, NR, S1/S2 +, no murmurs Pulmonary: Effort and breath sounds normal, no rhonchi, wheezes, rales.  Abdominal: Soft. BS +,  no distension, tenderness Musculoskeletal:  No edema.  TTP over right knee, patella Neuro: Alert. Normal muscle tone. No cranial nerve deficit.  Wearing braces on both LE.  Strength intact.  Skin: Skin is warm and dry. Red bug bites, indicative of bed bug bites Psychiatric: Tangential, did not understand many questions, repeatedly asking for a pillow.   Labs on Admission:  Basic Metabolic Panel:  Recent Labs Lab 03/15/15 1944 03/15/15 2008  NA 141 140  K 4.3 4.3  CL 105 105  CO2 26  --   GLUCOSE 140* 136*  BUN 29* 35*  CREATININE 0.78 0.80  CALCIUM 9.2  --    Liver Function Tests:  Recent Labs Lab 03/15/15 1944  AST 24  ALT 15  ALKPHOS 73  BILITOT 0.8  PROT 6.1*  ALBUMIN 3.7   CBC:  Recent Labs Lab 03/15/15 1944 03/15/15 2008  WBC 7.7  --   NEUTROABS 5.6  --   HGB 13.5 14.6  HCT 40.4 43.0  MCV 93.5  --   PLT 208  --    Cardiac Enzymes:  Recent Labs Lab 03/16/15 0032  TROPONINI <0.03   BNP: CBG:  Recent Labs Lab 03/15/15 1937  GLUCAP 140*    EKG: Normal sinus rhythm, no  ST/T wave changes   If 7PM-7AM, please contact night-coverage [email or web address] Password [email or web address] 03/16/2015, 1:37 AM

## 2015-03-15 NOTE — ED Notes (Signed)
Darel HongJudy with Environmental confirmed bedbugs in room; Suggestion to call [employer]; Secretary called [employer]

## 2015-03-15 NOTE — ED Notes (Signed)
From home via [facility], brief period of leg weakness while walking to bathroom, no fall, no neuro deficits, VSS, c/o HA, A/O X4

## 2015-03-15 NOTE — ED Provider Notes (Signed)
CSN: [id number]     Arrival date & time 03/15/15  1902 History   First MD Initiated Contact with Patient 03/15/15 1908     Chief Complaint  Patient presents with  . Weakness     (Consider location/radiation/quality/duration/timing/severity/associated sxs/prior Treatment) HPI Comments: Patient presents from home with episode of generalized weakness and fall. She states she was trying to walk to the bathroom and both of her legs gave out from under her and she landed on her knees. Her son was behind her and caught her she did not hit her head or lose consciousness. Patient has history of cerebral palsy and wears leg braces normally walks with a walker or cane. She reports she's been having a headache for the past week that is constant in the back of her head and improved with naproxen. Is associated with light and noise bothering her. No fever, nausea or vomiting. No chest pain or shortness of breath. Denies any history of headaches other than when she fell 6 months ago. Denies any falls since then. No focal weakness, numbness or tingling. No bowel or bladder incontinence. No vision changes.  The history is provided by the patient and a relative. The history is limited by the condition of the patient.    Past Medical History  Diagnosis Date  . CP (cerebral palsy) (HCC)    History reviewed. No pertinent past surgical history. No family history on file. Social History  Substance Use Topics  . Smoking status: Never Smoker   . Smokeless tobacco: None  . Alcohol Use: No   OB History    No data available     Review of Systems  Constitutional: Negative for fever, activity change, appetite change and fatigue.  HENT: Negative for congestion.   Eyes: Negative for visual disturbance.  Respiratory: Negative for cough, chest tightness, shortness of breath and wheezing.   Cardiovascular: Negative for chest pain.  Genitourinary: Negative for dysuria, hematuria, vaginal bleeding and vaginal  discharge.  Musculoskeletal: Positive for myalgias and arthralgias.  Neurological: Positive for weakness and headaches. Negative for dizziness, light-headedness and numbness.  A complete 10 system review of systems was obtained and all systems are negative except as noted in the HPI and PMH.      Allergies  Review of patient's allergies indicates no known allergies.  Home Medications   Prior to Admission medications   Medication Sig Start Date End Date Taking? Authorizing Provider  aspirin-acetaminophen-caffeine (EXCEDRIN MIGRAINE) [phone] MG tablet Take 1 tablet by mouth every 6 (six) hours as needed for headache.   Yes Historical Provider, MD   BP 123/66 mmHg  Pulse 75  Temp(Src) 98.3 F (36.8 C) (Oral)  Resp 19  SpO2 98% Physical Exam  Constitutional: She is oriented to person, place, and time. She appears well-developed and well-nourished. No distress.  HENT:  Head: Normocephalic and atraumatic.  Mouth/Throat: Oropharynx is clear and moist. No oropharyngeal exudate.  Eyes: Conjunctivae and EOM are normal. Pupils are equal, round, and reactive to light.  Neck: Normal range of motion. Neck supple.  No meningismus. No C spine tenderness  Cardiovascular: Normal rate, regular rhythm, normal heart sounds and intact distal pulses.   No murmur heard. Pulmonary/Chest: Effort normal and breath sounds normal. No respiratory distress.  Abdominal: Soft. There is no tenderness. There is no rebound and no guarding.  Musculoskeletal: Normal range of motion. She exhibits no edema or tenderness.  Neurological: She is alert and oriented to person, place, and time. No cranial nerve deficit.  She exhibits normal muscle tone. Coordination normal.  CN 2-12 intact, equal grip strengths bilaterally.  Leg braces in place. Able to lift each leg off the bed. Symmetric strength. Complains of some pain with range of motion of left hip. No ataxia on finger to nose.  Skin: Skin is warm.  Psychiatric:  She has a normal mood and affect. Her behavior is normal.  Nursing note and vitals reviewed.   ED Course  Procedures (including critical care time) Labs Review Labs Reviewed  COMPREHENSIVE METABOLIC PANEL - Abnormal; Notable for the following:    Glucose, Bld 140 (*)    BUN 29 (*)    Total Protein 6.1 (*)    All other components within normal limits  URINALYSIS, ROUTINE W REFLEX MICROSCOPIC ([facility]) - Abnormal; Notable for the following:    Color, Urine AMBER (*)    APPearance CLOUDY (*)    Hgb urine dipstick TRACE (*)    Bilirubin Urine SMALL (*)    Nitrite POSITIVE (*)    Leukocytes, UA SMALL (*)    All other components within normal limits  URINE MICROSCOPIC-ADD ON - Abnormal; Notable for the following:    Bacteria, UA MANY (*)    Casts HYALINE CASTS (*)    All other components within normal limits  I-STAT CHEM 8, ED - Abnormal; Notable for the following:    BUN 35 (*)    Glucose, Bld 136 (*)    All other components within normal limits  CBG MONITORING, ED - Abnormal; Notable for the following:    Glucose-Capillary 140 (*)    All other components within normal limits  URINE CULTURE  URINE CULTURE  ETHANOL  PROTIME-INR  APTT  CBC  DIFFERENTIAL  URINE RAPID DRUG SCREEN, HOSP PERFORMED  TSH  TROPONIN I  TROPONIN I  TROPONIN I  BASIC METABOLIC PANEL  CBC  HEMOGLOBIN A1C  I-STAT TROPOININ, ED    Imaging Review Ct Head Wo Contrast  03/15/2015  CLINICAL DATA:  SOMETHING FELL OFF A SHELF AND HIT HER HARD IN THE HEADNOW HAS SEVERE PAIN BASE OF SKULL AND ENTIRE NECK EXAM: CT HEAD WITHOUT CONTRAST CT CERVICAL SPINE WITHOUT CONTRAST TECHNIQUE: Multidetector CT imaging of the head and cervical spine was performed following the standard protocol without intravenous contrast. Multiplanar CT image reconstructions of the cervical spine were also generated. COMPARISON:  None. FINDINGS: CT HEAD FINDINGS Ventricles are normal configuration. There is ventricular and sulcal  enlargement reflecting mild diffuse atrophy. No hydrocephalus. There are no parenchymal masses or mass effect. There is no evidence of a cortical infarct. Periventricular white matter hypoattenuation is noted consistent with mild chronic microvascular ischemic change. There are no extra-axial masses or abnormal fluid collections. There is no intracranial hemorrhage. No skull fracture. Visualized sinuses and mastoid air cells are clear. CT CERVICAL SPINE FINDINGS No fracture. No spondylolisthesis. There is mild loss of disc height at C3-C4 with moderate loss disc height at C4-C5. Mild loss disc height is noted at C5-C6 and C6-C7. Uncovertebral spurring leads to moderate neural foraminal narrowing on the left at C3-C4-C4-C5. Facet degenerative changes are noted most evident at C7-T1. Bones are demineralized. Soft tissues are unremarkable.  Lung apices are clear. IMPRESSION: HEAD CT: No acute intracranial abnormalities. Mild atrophy and chronic microvascular ischemic change. CERVICAL CT:  No fracture or acute finding. Electronically Signed   By: Amie Portlandavid  Ormond M.D.   On: 03/15/2015 20:21   Ct Cervical Spine Wo Contrast  03/15/2015  CLINICAL DATA:  SOMETHING  FELL OFF A SHELF AND HIT HER HARD IN THE HEADNOW HAS SEVERE PAIN BASE OF SKULL AND ENTIRE NECK EXAM: CT HEAD WITHOUT CONTRAST CT CERVICAL SPINE WITHOUT CONTRAST TECHNIQUE: Multidetector CT imaging of the head and cervical spine was performed following the standard protocol without intravenous contrast. Multiplanar CT image reconstructions of the cervical spine were also generated. COMPARISON:  None. FINDINGS: CT HEAD FINDINGS Ventricles are normal configuration. There is ventricular and sulcal enlargement reflecting mild diffuse atrophy. No hydrocephalus. There are no parenchymal masses or mass effect. There is no evidence of a cortical infarct. Periventricular white matter hypoattenuation is noted consistent with mild chronic microvascular ischemic change. There  are no extra-axial masses or abnormal fluid collections. There is no intracranial hemorrhage. No skull fracture. Visualized sinuses and mastoid air cells are clear. CT CERVICAL SPINE FINDINGS No fracture. No spondylolisthesis. There is mild loss of disc height at C3-C4 with moderate loss disc height at C4-C5. Mild loss disc height is noted at C5-C6 and C6-C7. Uncovertebral spurring leads to moderate neural foraminal narrowing on the left at C3-C4-C4-C5. Facet degenerative changes are noted most evident at C7-T1. Bones are demineralized. Soft tissues are unremarkable.  Lung apices are clear. IMPRESSION: HEAD CT: No acute intracranial abnormalities. Mild atrophy and chronic microvascular ischemic change. CERVICAL CT:  No fracture or acute finding. Electronically Signed   By: Amie Portland M.D.   On: 03/15/2015 20:21   Dg Pelvis Portable  03/15/2015  CLINICAL DATA:  Leg weakness while trying to get to the bathroom. EXAM: PORTABLE PELVIS 1-2 VIEWS COMPARISON:  None. FINDINGS: Degenerative changes in the lower lumbar spine and in both hips. No evidence of acute fracture or dislocation of the pelvis. SI joints and symphysis pubis are not displaced. Sacral struts are symmetrical. No focal bone lesion or bone destruction. Calcified phleboliths in the pelvis. IMPRESSION: No acute bony abnormalities. Electronically Signed   By: Burman Nieves M.D.   On: 03/15/2015 21:20   I have personally reviewed and evaluated these images and lab results as part of my medical decision-making.   EKG Interpretation   Date/Time:  Friday March 15 2015 19:36:06 EDT Ventricular Rate:  76 PR Interval:  125 QRS Duration: 98 QT Interval:  380 QTC Calculation: 427 R Axis:   29 Text Interpretation:  Pacemaker spikes or artifacts Sinus rhythm RSR' in  V1 or V2, right VCD or RVH Artifact in lead(s) I II aVR No previous ECGs  available Confirmed by Manus Gunning  MD, Arvo Ealy [phone]) on 03/15/2015 8:06:13  PM      MDM   Final  diagnoses:  Fall  Urinary tract infection without hematuria, site unspecified  Weakness   1 week of headache with episode of legs giving out from under her and falling onto her knees today. No loss of consciousness. Did not hit head.   EKG Nsr. CT head and C spine negative. Labs positive for UTI.  Patient states unable to stand and walk which she does normally with a walker. She appears disheveled and deconditioned.   Would benefit from admission for further investigation into etiology of fall.  Treat UTI.  D/w Dr. Criselda Peaches.    Glynn Octave, MD 03/16/15 [phone]

## 2015-03-15 NOTE — ED Notes (Signed)
While performing In out RN noticed Bedbug crawling on pt; Sample obtained and charge Rn and md notified; Pt placed on contact precautions and xray changed to portable; Pt verified that she has hx of bedbugs and has bit makes on bilateral upper extremities; Pt was soiled and states she usually wets herself at home and just sit in it; Pt states she does not wear briefs only regular under wear; Md notified that pt states she is unable to walk and can not be ambulated.

## 2015-03-16 ENCOUNTER — Observation Stay (HOSPITAL_COMMUNITY): Payer: Medicare Other

## 2015-03-16 ENCOUNTER — Encounter (HOSPITAL_COMMUNITY): Payer: Self-pay | Admitting: General Practice

## 2015-03-16 DIAGNOSIS — M25562 Pain in left knee: Secondary | ICD-10-CM | POA: Diagnosis not present

## 2015-03-16 DIAGNOSIS — R519 Headache, unspecified: Secondary | ICD-10-CM

## 2015-03-16 DIAGNOSIS — M1711 Unilateral primary osteoarthritis, right knee: Secondary | ICD-10-CM | POA: Diagnosis not present

## 2015-03-16 DIAGNOSIS — M1712 Unilateral primary osteoarthritis, left knee: Secondary | ICD-10-CM | POA: Diagnosis not present

## 2015-03-16 DIAGNOSIS — M25561 Pain in right knee: Secondary | ICD-10-CM | POA: Diagnosis not present

## 2015-03-16 DIAGNOSIS — B888 Other specified infestations: Secondary | ICD-10-CM | POA: Diagnosis present

## 2015-03-16 DIAGNOSIS — N12 Tubulo-interstitial nephritis, not specified as acute or chronic: Secondary | ICD-10-CM

## 2015-03-16 DIAGNOSIS — M25569 Pain in unspecified knee: Secondary | ICD-10-CM

## 2015-03-16 DIAGNOSIS — R51 Headache: Secondary | ICD-10-CM

## 2015-03-16 LAB — CBC
HEMATOCRIT: 34.3 % — AB (ref 36.0–46.0)
HEMOGLOBIN: 11.7 g/dL — AB (ref 12.0–15.0)
MCH: 31.8 pg (ref 26.0–34.0)
MCHC: 34.1 g/dL (ref 30.0–36.0)
MCV: 93.2 fL (ref 78.0–100.0)
Platelets: 190 10*3/uL (ref 150–400)
RBC: 3.68 MIL/uL — ABNORMAL LOW (ref 3.87–5.11)
RDW: 12.7 % (ref 11.5–15.5)
WBC: 6.5 10*3/uL (ref 4.0–10.5)

## 2015-03-16 LAB — BASIC METABOLIC PANEL
ANION GAP: 7 (ref 5–15)
BUN: 26 mg/dL — ABNORMAL HIGH (ref 6–20)
CO2: 26 mmol/L (ref 22–32)
Calcium: 8.4 mg/dL — ABNORMAL LOW (ref 8.9–10.3)
Chloride: 104 mmol/L (ref 101–111)
Creatinine, Ser: 0.66 mg/dL (ref 0.44–1.00)
GFR calc Af Amer: >60 mL/min (ref >60)
GLUCOSE: 103 mg/dL — AB (ref 65–99)
POTASSIUM: 3.8 mmol/L (ref 3.5–5.1)
Sodium: 137 mmol/L (ref 135–145)

## 2015-03-16 LAB — TROPONIN I
Troponin I: <0.03 ng/mL (ref <0.031)
Troponin I: <0.03 ng/mL (ref <0.031)

## 2015-03-16 LAB — TSH: TSH: 4.353 u[IU]/mL (ref 0.350–4.500)

## 2015-03-16 MED ORDER — DEXTROSE 5 % IV SOLN
1.0000 g | INTRAVENOUS | Status: DC
  Administered 2015-03-16 – 2015-03-17 (×2): 1 g via INTRAVENOUS
  Filled 2015-03-16 (×3): qty 10

## 2015-03-16 MED ORDER — ACETAMINOPHEN 325 MG PO TABS
650.0000 mg | ORAL_TABLET | Freq: Four times a day (QID) | ORAL | Status: DC | PRN
  Administered 2015-03-16: 650 mg via ORAL
  Filled 2015-03-16: qty 2

## 2015-03-16 MED ORDER — ENSURE ENLIVE PO LIQD
237.0000 mL | Freq: Two times a day (BID) | ORAL | Status: DC
  Administered 2015-03-16 – 2015-03-18 (×4): 237 mL via ORAL

## 2015-03-16 MED ORDER — INFLUENZA VAC SPLIT QUAD 0.5 ML IM SUSY
0.5000 mL | PREFILLED_SYRINGE | INTRAMUSCULAR | Status: AC
  Administered 2015-03-17: 0.5 mL via INTRAMUSCULAR
  Filled 2015-03-16: qty 0.5

## 2015-03-16 MED ORDER — DIPHENHYDRAMINE HCL 50 MG/ML IJ SOLN
12.5000 mg | Freq: Once | INTRAMUSCULAR | Status: AC
  Administered 2015-03-16: 12.5 mg via INTRAVENOUS
  Filled 2015-03-16: qty 1

## 2015-03-16 MED ORDER — ZOLPIDEM TARTRATE 5 MG PO TABS
5.0000 mg | ORAL_TABLET | Freq: Every evening | ORAL | Status: DC | PRN
  Administered 2015-03-16: 5 mg via ORAL
  Filled 2015-03-16: qty 1

## 2015-03-16 MED ORDER — METOCLOPRAMIDE HCL 5 MG/ML IJ SOLN
10.0000 mg | Freq: Once | INTRAMUSCULAR | Status: AC
  Administered 2015-03-16: 10 mg via INTRAVENOUS
  Filled 2015-03-16: qty 2

## 2015-03-16 MED ORDER — DEXTROSE 5 % IV SOLN: 1.0000 g | Freq: Once | INTRAVENOUS | Status: DC

## 2015-03-16 MED ORDER — KETOROLAC TROMETHAMINE 15 MG/ML IJ SOLN
15.0000 mg | Freq: Once | INTRAMUSCULAR | Status: AC
  Administered 2015-03-16: 15 mg via INTRAVENOUS
  Filled 2015-03-16: qty 1

## 2015-03-16 NOTE — Evaluation (Signed)
Physical Therapy Evaluation Patient Details Name: Abigail Thomas MRN: [id number] DOB: Dec 19, 1945 Today's Date: 03/16/2015   History of Present Illness  69 yo female with onset of confusion and a fall to her knees with suspected UTI, continued confusion.    Clinical Impression  Pt was seen for assessment of her tolerance of transfers without adaptive equipment and will need to be helped with more than one person to [facility] and to chair.  Her plan is SNF as she is home with minimal help and will not be able to manage blocks of time alone.  Have spoken to nursing about the issues and with staff ed to assist her with 2 people.  Will anticipate her recovery to be good as she can scoot on her hips to assist positioning in chair once transitioned    Follow Up Recommendations SNF    Equipment Recommendations  None recommended by PT (await SNF disposition)    Recommendations for Other Services       Precautions / Restrictions Precautions Precautions: Fall Precaution Comments: contact Restrictions Weight Bearing Restrictions: No      Mobility  Bed Mobility Overal bed mobility: Needs Assistance Bed Mobility: Supine to Sit;Sit to Supine     Supine to sit: Mod assist Sit to supine: Mod assist   General bed mobility comments: pt is weaker and has struggle to use UE's esp R due to CP changes  Transfers Overall transfer level: Needs assistance Equipment used: 1 person hand held assist (hands on to control her pivot without shoes which are bagged) Transfers: Sit to/from [employer] CorporationStand;Stand Pivot Transfers Sit to Stand: Mod assist Stand pivot transfers: Mod assist;Max assist       General transfer comment: pt has willingness to get up but needs upright shoes for her best performance, but are bagged  Ambulation/Gait         Gait velocity: not attempted due to shoes in a bag      Stairs            Wheelchair Mobility    Modified Rankin (Stroke Patients Only)        Balance Overall balance assessment: Needs assistance Sitting-balance support: Feet supported;Bilateral upper extremity supported Sitting balance-Leahy Scale: Poor       Standing balance-Leahy Scale: Poor                               Pertinent Vitals/Pain Pain Assessment: No/denies pain    Home Living Family/patient expects to be discharged to:: Private residence Living Arrangements: Other relatives Available Help at Discharge: Family;Available PRN/intermittently Type of Home: House       Home Layout: One level Home Equipment: Wheelchair - manual Additional Comments: incomplete equipment listing due to pt cognition    Prior Function Level of Independence: Independent with assistive device(s) (wheelchair to mobilize with upright braces on her shoes)               Hand Dominance        Extremity/Trunk Assessment   Upper Extremity Assessment: RUE deficits/detail (CP history)           Lower Extremity Assessment: RLE deficits/detail;LLE deficits/detail RLE Deficits / Details: PF posture R foot with uprights in shoes LLE Deficits / Details: mild PF foot posture and upright shoes are bagged due to bedbugs  Cervical / Trunk Assessment: Normal  Communication   Communication: No difficulties  Cognition Arousal/Alertness: Awake/alert Behavior [facility] for  tasks assessed/performed Overall Cognitive Status: Within Functional Limits for tasks assessed       Memory: Decreased short-term memory              General Comments General comments (skin integrity, edema, etc.): Pt was able to control her sitting effort with some cues and trunk support, but is worried about standing without shoes.  Not clear how long her shoes are unavailable to her.     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)   PT Problem List Decreased  strength;Decreased range of motion;Decreased activity tolerance;Decreased balance;Decreased mobility;Decreased coordination;Decreased cognition;Decreased knowledge of use of DME;Decreased safety awareness;Decreased knowledge of precautions;Decreased skin integrity;Impaired tone  PT Treatment Interventions DME instruction;Gait training;Functional mobility training;Therapeutic activities;Therapeutic exercise;Balance training;Neuromuscular re-education;Patient/family education   PT Goals (Current goals can be found in the Care Plan section) Acute Rehab PT Goals Patient Stated Goal: to get back to moving herself in wc and some walking PT Goal Formulation: With patient Time For Goal Achievement: 03/30/15 Potential to Achieve Goals: Good    Frequency Min 3X/week   Barriers to discharge Decreased caregiver support has blocks of time alone at home    Co-evaluation               End of Session   Activity Tolerance: Patient tolerated treatment well;Other (comment) (needs upright shoes) Patient left: in chair;with call bell/phone within reach;Other (comment) (inserviced nursing about using BSC wiht her) Nurse Communication: Mobility status;Other (comment) (transition to bed and BSC)    Functional Assessment Tool Used: clinical judgment Functional Limitation: Changing and maintaining body position Changing and Maintaining Body Position Current Status (Z6109): At least 40 percent but less than 60 percent impaired, limited or restricted Changing and Maintaining Body Position Goal Status (U0454): At least 40 percent but less than 60 percent impaired, limited or restricted    Time: 1003-1023 PT Time Calculation (min) (ACUTE ONLY): 20 min   Charges:   PT Evaluation $Initial PT Evaluation Tier I: 1 Procedure     PT G Codes:   PT G-Codes **NOT FOR INPATIENT CLASS** Functional Assessment Tool Used: clinical judgment Functional Limitation: Changing and maintaining body position Changing and  Maintaining Body Position Current Status (U9811): At least 40 percent but less than 60 percent impaired, limited or restricted Changing and Maintaining Body Position Goal Status (B1478): At least 40 percent but less than 60 percent impaired, limited or restricted    Ivar Drape 03/16/2015, 1:26 PM   Samul Dada, PT MS Acute Rehab Dept. Number: [id number] and [facility] [phone]

## 2015-03-16 NOTE — Progress Notes (Signed)
Renetta ChalkJessie Coltrin [id number] DOB: 05-26-1946 DOA: 03/15/2015 PCP: No PCP Per Patient  Brief narrative:  69 y/o ? History of cerebral palsy with talipes equinovarus No other issues Admitted for fall and at baseline usually walks with a walker Also found to have pyelonephritis  Past medical history-As per Problem list Chart reviewed as below-   Consultants:    Procedures:    Antibiotics:  Rocephin 10/14    Subjective  Alert pleasant oriented No further issues No nausea no vomiting Tolerating diet   Objective    Interim History:   Telemetry: Sinus c PVC   Objective: Filed Vitals:   03/16/15 0020 03/16/15 0221 03/16/15 0424 03/16/15 0620  BP: 123/66 114/54 99/50 98/50   Pulse: 75 70 72 70  Temp: 98.3 F (36.8 C) 98.7 F (37.1 C) 97.6 F (36.4 C)   TempSrc: Oral  Oral   Resp: 19  18 19   Height: 5\' 7"  (1.702 m)     Weight: 75.569 kg (166 lb 9.6 oz)     SpO2: 98% 98% 98% 99%    Intake/Output Summary (Last 24 hours) at 03/16/15 1314 Last data filed at 03/16/15 [id number]  Gross per 24 hour  Intake    290 ml  Output    100 ml  Net    190 ml    Exam:  General: EOMI NCAT pleasant Cardiovascular: S1-S2 no murmur rub or gallop Respiratory: Clinically clear no added sound Abdomen: Soft nontender nondistended no rebound no guarding Skin intact Neuro no focal deficit power 5/5.  LE equinovarus noted.  Data Reviewed: Basic Metabolic Panel:  Recent Labs Lab 03/15/15 1944 03/15/15 2008 03/16/15 0619  NA 141 140 137  K 4.3 4.3 3.8  CL 105 105 104  CO2 26  --  26  GLUCOSE 140* 136* 103*  BUN 29* 35* 26*  CREATININE 0.78 0.80 0.66  CALCIUM 9.2  --  8.4*   Liver Function Tests:  Recent Labs Lab 03/15/15 1944  AST 24  ALT 15  ALKPHOS 73  BILITOT 0.8  PROT 6.1*  ALBUMIN 3.7   No results for input(s): LIPASE, AMYLASE in the last 168 hours. No results for input(s): AMMONIA in the last 168 hours. CBC:  Recent Labs Lab 03/15/15 1944  03/15/15 2008 03/16/15 0619  WBC 7.7  --  6.5  NEUTROABS 5.6  --   --   HGB 13.5 14.6 11.7*  HCT 40.4 43.0 34.3*  MCV 93.5  --  93.2  PLT 208  --  190   Cardiac Enzymes:  Recent Labs Lab 03/16/15 0032 03/16/15 0619 03/16/15 1145  TROPONINI <0.03 <0.03 <0.03   BNP: Invalid input(s): POCBNP CBG:  Recent Labs Lab 03/15/15 1937  GLUCAP 140*    No results found for this or any previous visit (from the past 240 hour(s)).   Studies:              All Imaging reviewed and is as per above notation   Scheduled Meds: . enoxaparin (LOVENOX) injection  40 mg Subcutaneous Daily  . feeding supplement (ENSURE ENLIVE)  237 mL Oral BID BM  . [START ON 03/17/2015] Influenza vac split quadrivalent PF  0.5 mL Intramuscular Tomorrow-1000  . sodium chloride  3 mL Intravenous Q12H   Continuous Infusions:    Assessment/Plan:  Fall - I am currently unclear what illicited this fall. From discussion with ED, appeared to be mechanical. However, she is confused and may have an infection - Monitor on telemetry overnight - Trend  TnI  - AM EKG - IVF at 75cc/hr saline locked 10/15 - PT/OT eval pending  Knee pain, bilateral - She landed on knees and had some pain to palpation, particularly on the right.  - Xrays of bilateral knees - Pain control can be given PRN if needed   Urinary tract Infection - I am concerned, given her confusion and fall, she may have a true infection - She was given Rocephin in the ED - UC ordered - contiue Rocephin until UC resulting   Infestation by bed bug - Contact precautions  Headache - Will try migraine cocktail, reglan, benadryl, toradol - to see if this helps - CT head and neck without acute findings.    Discussed with son No other issues potentially d/c home am if no uti  Pleas Koch, MD  [facility] Pager [phone] 03/16/2015, 1:14 PM

## 2015-03-16 NOTE — Progress Notes (Signed)
Patient trans in from ED to 5w08 for knee pain, bilaterally. On arrival alert and oriented. Oriented to the room and placed on cardiac monitor. Made comfortable in bed and call bell placed within reach.

## 2015-03-16 NOTE — Progress Notes (Signed)
Nutrition Brief Note  Patient identified on the Malnutrition Screening Tool (MST) Report  Wt Readings from Last 15 Encounters:  03/16/15 166 lb 9.6 oz (75.569 kg)  01/18/14 150 lb (68.04 kg)    Body mass index is 26.09 kg/(m^2). Patient meets criteria for overweight based on current BMI.   Patient was slightly confused on RD assessment as she sometimes gave contradicting responses.   She says she was admitted to find out about a lump on her head.   She initially thought she had lost weight, but was unsure why, as she eats well (3 meals daily) and has a family member who helps her with all her meals. On further discussion she states that her normal weight is about what she is at now. Denies wt loss. Denied following any diet or taking any supplements.   Current diet order is regular, patient is consuming approximately 100% of meals at this time. Labs and medications reviewed.   No nutrition interventions warranted at this time. If nutrition issues arise, please consult RD.   Christophe LouisNathan Jadore Mcguffin RD, LDN Nutrition Pager: [phone] 03/16/2015 11:09 AM

## 2015-03-17 DIAGNOSIS — M25561 Pain in right knee: Secondary | ICD-10-CM | POA: Diagnosis not present

## 2015-03-17 DIAGNOSIS — B888 Other specified infestations: Secondary | ICD-10-CM | POA: Diagnosis not present

## 2015-03-17 DIAGNOSIS — E46 Unspecified protein-calorie malnutrition: Secondary | ICD-10-CM | POA: Diagnosis not present

## 2015-03-17 DIAGNOSIS — Y92009 Unspecified place in unspecified non-institutional (private) residence as the place of occurrence of the external cause: Secondary | ICD-10-CM | POA: Diagnosis not present

## 2015-03-17 DIAGNOSIS — M25562 Pain in left knee: Secondary | ICD-10-CM | POA: Diagnosis not present

## 2015-03-17 DIAGNOSIS — Q660 Congenital talipes equinovarus: Secondary | ICD-10-CM | POA: Diagnosis not present

## 2015-03-17 DIAGNOSIS — G809 Cerebral palsy, unspecified: Secondary | ICD-10-CM | POA: Diagnosis not present

## 2015-03-17 DIAGNOSIS — R531 Weakness: Secondary | ICD-10-CM | POA: Diagnosis present

## 2015-03-17 DIAGNOSIS — N12 Tubulo-interstitial nephritis, not specified as acute or chronic: Secondary | ICD-10-CM | POA: Diagnosis not present

## 2015-03-17 DIAGNOSIS — Z23 Encounter for immunization: Secondary | ICD-10-CM | POA: Diagnosis not present

## 2015-03-17 DIAGNOSIS — W19XXXA Unspecified fall, initial encounter: Secondary | ICD-10-CM | POA: Diagnosis present

## 2015-03-17 DIAGNOSIS — R51 Headache: Secondary | ICD-10-CM | POA: Diagnosis present

## 2015-03-17 NOTE — Progress Notes (Signed)
Abigail ChalkJessie Thomas [id number] DOB: [birth year] DOA: 03/15/2015 PCP: No PCP Per Patient  Brief narrative:  69 y/o ? History of cerebral palsy with talipes equinovarus No other issues Admitted for fall and at baseline usually walks with a walker Also found to have pyelonephritis  Past medical history-As per Problem list Chart reviewed as below-   Consultants:    Procedures:    Antibiotics:  Rocephin 10/14    Subjective   Confused per RN this am [facility] me [facility] nor [facility]. Tol diet very well .  NO     Objective    Interim History:   Telemetry: Sinus c PVC   Objective: Filed Vitals:   03/16/15 0424 03/16/15 0620 03/16/15 1727 03/16/15 2208  BP: 99/50 98/50 127/73 125/65  Pulse: 72 70 75 83  Temp: 97.6 F (36.4 C)  98 F (36.7 C) 99.2 F (37.3 C)  TempSrc: Oral  Oral Oral  Resp: 18 19 18 16   Height:      Weight:      SpO2: 98% 99% 100% 96%    Intake/Output Summary (Last 24 hours) at 03/17/15 0836 Last data filed at 03/17/15 0700  Gross per 24 hour  Intake    290 ml  Output   1425 ml  Net  -1135 ml    Exam:  General: EOMI NCAT pleasant Cardiovascular: S1-S2 no murmur rub or gallop Respiratory: Clinically clear no added sound Abdomen: Soft nontender nondistended no rebound no guarding  Data Reviewed: Basic Metabolic Panel:  Recent Labs Lab 03/15/15 1944 03/15/15 2008 03/16/15 0619  NA 141 140 137  K 4.3 4.3 3.8  CL 105 105 104  CO2 26  --  26  GLUCOSE 140* 136* 103*  BUN 29* 35* 26*  CREATININE 0.78 0.80 0.66  CALCIUM 9.2  --  8.4*   Liver Function Tests:  Recent Labs Lab 03/15/15 1944  AST 24  ALT 15  ALKPHOS 73  BILITOT 0.8  PROT 6.1*  ALBUMIN 3.7   No results for input(s): LIPASE, AMYLASE in the last 168 hours. No results for input(s): AMMONIA in the last 168 hours. CBC:  Recent Labs Lab 03/15/15 1944 03/15/15 2008 03/16/15 0619  WBC 7.7  --  6.5  NEUTROABS 5.6   --   --   HGB 13.5 14.6 11.7*  HCT 40.4 43.0 34.3*  MCV 93.5  --  93.2  PLT 208  --  190   Cardiac Enzymes:  Recent Labs Lab 03/16/15 0032 03/16/15 0619 03/16/15 1145  TROPONINI <0.03 <0.03 <0.03   BNP: Invalid input(s): POCBNP CBG:  Recent Labs Lab 03/15/15 1937  GLUCAP 140*    No results found for this or any previous visit (from the past 240 hour(s)).   Studies:              All Imaging reviewed and is as per above notation   Scheduled Meds: . cefTRIAXone (ROCEPHIN)  IV  1 g Intravenous Q24H  . enoxaparin (LOVENOX) injection  40 mg Subcutaneous Daily  . feeding supplement (ENSURE ENLIVE)  237 mL Oral BID BM  . Influenza vac split quadrivalent PF  0.5 mL Intramuscular Tomorrow-1000  . sodium chloride  3 mL Intravenous Q12H   Continuous Infusions:    Assessment/Plan:  Fall - etiology unclear -confused somewhat as basleine - d/ctelemetry overnight - IVF at 75cc/hr saline locked 10/15 - PT/OT eval rec SNF-family wants her home instead  Knee pain, bilateral -  She landed on knees and had some pain to palpation, particularly on the right.  - Xrays of bilateral knees only arthritis no #    Urinary tract Infection, UC pending - UC pend - contiue Rocephin until UC resulting   Infestation by bed bug - Contact precautions  Headache - no co today   Discussed with son No other issues potentially d/c home once culture back  Pleas Koch, MD  [facility] Pager [phone] 03/17/2015, 8:36 AM

## 2015-03-17 NOTE — Social Work (Signed)
CSW met with patient for CSW consult. Patient is refusing SNF and states that she would like to go home with [facility] services. CSW spoke with son who confirmed that he will provide care for the patient at home.  CSW made RNCM aware for ongoing support.  No CSW needs at this time.  CSW signing off.  Christene Lye MSW, [place]

## 2015-03-18 DIAGNOSIS — M25569 Pain in unspecified knee: Secondary | ICD-10-CM | POA: Insufficient documentation

## 2015-03-18 LAB — HEMOGLOBIN A1C
Hgb A1c MFr Bld: 5.4 % (ref 4.8–5.6)
MEAN PLASMA GLUCOSE: 108 mg/dL

## 2015-03-18 LAB — URINE CULTURE

## 2015-03-18 MED ORDER — NITROFURANTOIN MACROCRYSTAL 100 MG PO CAPS: 100.0000 mg | ORAL_CAPSULE | Freq: Two times a day (BID) | ORAL | Status: DC

## 2015-03-18 MED ORDER — LORAZEPAM 2 MG/ML IJ SOLN
0.5000 mg | Freq: Once | INTRAMUSCULAR | Status: AC
  Administered 2015-03-18: 0.5 mg via INTRAMUSCULAR
  Filled 2015-03-18: qty 1

## 2015-03-18 MED ORDER — ENSURE ENLIVE PO LIQD: 237.0000 mL | Freq: Two times a day (BID) | ORAL | Status: DC

## 2015-03-18 NOTE — Evaluation (Signed)
Occupational Therapy Evaluation Patient Details Name: Abigail Thomas MRN: [id number] DOB: 10-15-1945 Today's Date: 03/18/2015    History of Present Illness 69 y.o. female with onset of confusion and a fall to her knees with suspected UTI, continued confusion. History of CP.   Clinical Impression   Pt admitted with above. Per family, pt getting assist with ADLs, PTA. Feel pt will benefit from acute OT to increase independence prior to d/c. Recommending HHOT, but family does not seem interested.    Follow Up Recommendations  Home health OT;Supervision/Assistance - 24 hour    Equipment Recommendations  None recommended by OT    Recommendations for Other Services       Precautions / Restrictions Precautions Precautions: Fall Precaution Comments: contact-bed bugs Required Braces or Orthoses:  (braces on shoes) Restrictions Weight Bearing Restrictions: No      Mobility Bed Mobility Overal bed mobility: Needs Assistance Bed Mobility: Supine to Sit     Supine to sit: Min assist     General bed mobility comments: assist with LEs.  Transfers Overall transfer level: Needs assistance Equipment used: Rolling walker (2 wheeled) Transfers: Sit to/from Stand Sit to Stand: Mod assist;+2 safety/equipment         General transfer comment: family assisted with transfer. Offered to practice stand pivot to [facility] but family declined.    Balance    History of fall. Assist with standing balance and use of RW.                                        ADL Overall ADL's : Needs assistance/impaired                     Lower Body Dressing: Total assistance;Sit to/from stand;+2 for physical assistance   Toilet Transfer: Moderate assistance;RW;+2 for safety/equipment (sit to stand from bed)             General ADL Comments: Family came in during session and dressed pt. Tried to encourage for pt to try to don panties, but family reported she can't do that.  Explained role of OT and HHOT but son not interested. Suggested use of gait belt as son pulled up on pt's arm. Recommended he pick up rugs/items off of floor.     Vision     Perception     Praxis      Pertinent Vitals/Pain Pain Assessment: No/denies pain     Hand Dominance     Extremity/Trunk Assessment  RUE deficits/details: history of CP   Lower Extremity Assessment Lower Extremity Assessment: Defer to PT evaluation       Communication Communication Communication: No difficulties   Cognition Arousal/Alertness: Awake/alert Behavior During Therapy: WFL for tasks assessed/performed Overall Cognitive Status: History of cognitive impairments - at baseline (unsure if it is baseline;reports she gets confused at times) Area of Impairment: Orientation Orientation Level: Disoriented to;Place;Time;Situation                 General Comments       Exercises       Shoulder Instructions      Home Living Family/patient expects to be discharged to:: Private residence Living Arrangements: Other relatives Available Help at Discharge: Family;Available PRN/intermittently Type of Home: House       Home Layout: One level     Bathroom Shower/Tub: Tub/shower unit         Home  Equipment: Wheelchair - manual;Shower seat;Bedside commode; per chart uses a walker          Prior Functioning/Environment Level of Independence: Needs assistance    ADL's / Homemaking Assistance Needed: assist with dressing and bathing her back        OT Diagnosis: Generalized weakness   OT Problem List: Decreased knowledge of precautions;Decreased knowledge of use of DME or AE;Decreased cognition;Decreased safety awareness;Decreased strength;Impaired balance (sitting and/or standing);Decreased range of motion   OT Treatment/Interventions: Therapeutic activities;Cognitive remediation/compensation;Balance training;Patient/family education;Self-care/ADL training;DME and/or AE  instruction;Therapeutic exercise    OT Goals(Current goals can be found in the care plan section) None set due to plan for pt to leave today  OT Frequency:    Barriers to D/C:            Co-evaluation              End of Session Equipment Utilized During Treatment: Gait belt;Rolling walker Nurse Communication: Other (comment) (ready to go; Family doesn't want HH)  Activity Tolerance: Patient tolerated treatment well Patient left: in bed;with family/visitor present   Time: 1142-1153 OT Time Calculation (min): 11 min Charges:  OT General Charges $OT Visit: 1 Procedure OT Evaluation $Initial OT Evaluation Tier I: 1 Procedure G-CodesEarlie Raveling:    Gwendy Boeder L OTR/L [phone] 03/18/2015, 12:20 PM

## 2015-03-18 NOTE — Progress Notes (Signed)
Patient was discharged home by MD order; discharged instructions  review and give to patient and her son with care notes; IV DIC;  patient will be escorted to the car by a volunteer via wheelchair.  

## 2015-03-18 NOTE — Discharge Summary (Signed)
Physician Discharge Summary  Rosenda Geffrard ZOX:[id number] DOB: [birth year] DOA: 03/15/2015  PCP: No PCP Per Patient  Admit date: 03/15/2015 Discharge date: 03/18/2015  Time spent: 25 minutes  Recommendations for Outpatient Follow-up:  1. Patient should complete Macrodantin 100 twice a day on 10/20 2. Patient should follow with a regular physician soon 3. Patient has been recommended in the past skilled care and has had falls but family selected to take home and will attempt to arrange long-term care at home with home health 4. Sterilization should occur at home for bed bugs    Discharge Diagnoses:  Principal Problem:   Fall Active Problems:   Knee pain, bilateral   Urinary tract infectious disease   Infestation by bed bug   Headache   Pyelonephritis   Discharge Condition: Fair  Diet recommendation: Regular  Filed Weights   2015/04/12 0020  Weight: 75.569 kg (166 lb 9.6 oz)    History of present illness:  69 y/o ? History of cerebral palsy with talipes equinovarus No other issues Admitted for fall and at baseline usually walks with a walker Also found to have pyelonephritis  Hospital Course:   Fall - etiology unclear -confused somewhat as basleine - d/ctelemetry overnight - IVF at 75cc/hr saline locked Apr 12, 2023 - PT/OT eval rec SNF-family wants her home instead\ -We will request home health PT OT to follow-up  Knee pain, bilateral - She landed on knees and had some pain to palpation, particularly on the right.  - Xrays of bilateral knees only arthritis no #  Pyelonephritis secondary to Escherichia coliUC pend -  was on Rocephin -sensitivities concerning for Escherichia coli therefore changed to Macrodantin which is sensitive to to complete on 10/20    Infestation by bed bug - Contact precautions  Headache - no co today  Very mild-Malnutrition Shakes as needed  Discharge Exam: Filed Vitals:   03/18/15 0634  BP: 126/68  Pulse: 77  Temp: 98.7 F  (37.1 C)  Resp: 17    General:  EOMI NCAT no apparent distress  Cardiovascular: S1-S2 no murmur rub or gallop  Abdomen soft nontender nondistended no rebound  Discharge Instructions    Current Discharge Medication List    START taking these medications   Details  feeding supplement, ENSURE ENLIVE, (ENSURE ENLIVE) LIQD Take 237 mLs by mouth 2 (two) times daily between meals. Qty: 237 mL, Refills: 12    nitrofurantoin (MACRODANTIN) 100 MG capsule Take 1 capsule (100 mg total) by mouth 2 (two) times daily. Qty: 6 capsule, Refills: 0      STOP taking these medications     aspirin-acetaminophen-caffeine (EXCEDRIN MIGRAINE) 250-250-65 MG tablet        No Known Allergies    The results of significant diagnostics from this hospitalization (including imaging, microbiology, ancillary and laboratory) are listed below for reference.    Significant Diagnostic Studies: Dg Knee 1-2 Views Left  04/12/2015  CLINICAL DATA:  Anterior knee pain and instability. History of cerebral palsy. No trauma history submitted. EXAM: LEFT KNEE - 1-2 VIEW COMPARISON:  None. FINDINGS: AP and lateral views. 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 Joint space narrowing and subchondral sclerosis. No acute fracture or dislocation. No joint effusion. Vascular calcifications. IMPRESSION: Degenerative change, without acute osseous finding. Electronically Signed   By: Jeronimo Greaves M.D.   On: April 12, 2015 14:48   Dg Knee 1-2 Views Right  04-12-15  CLINICAL DATA:  Acute pain and instability EXAM: RIGHT KNEE - 1-2 VIEW COMPARISON:  None. FINDINGS: Two views of  the right knee submitted. No acute fracture or subluxation. There is diffuse narrowing of joint space most significant laterally. There is spurring of lateral tibial plateau. Spurring of medial and lateral femoral condyle. Significant narrowing of patellofemoral joint space. Spurring of patella. Small joint effusion. IMPRESSION: No acute fracture or  subluxation. Osteoarthritic changes as described above. Electronically Signed   By: Natasha MeadLiviu  Pop M.D.   On: 03/16/2015 14:49   Ct Head Wo Contrast  03/15/2015  CLINICAL DATA:  SOMETHING FELL OFF A SHELF AND HIT HER HARD IN THE HEADNOW HAS SEVERE PAIN BASE OF SKULL AND ENTIRE NECK EXAM: CT HEAD WITHOUT CONTRAST CT CERVICAL SPINE WITHOUT CONTRAST TECHNIQUE: Multidetector CT imaging of the head and cervical spine was performed following the standard protocol without intravenous contrast. Multiplanar CT image reconstructions of the cervical spine were also generated. COMPARISON:  None. FINDINGS: CT HEAD FINDINGS Ventricles are normal configuration. There is ventricular and sulcal enlargement reflecting mild diffuse atrophy. No hydrocephalus. There are no parenchymal masses or mass effect. There is no evidence of a cortical infarct. Periventricular white matter hypoattenuation is noted consistent with mild chronic microvascular ischemic change. There are no extra-axial masses or abnormal fluid collections. There is no intracranial hemorrhage. No skull fracture. Visualized sinuses and mastoid air cells are clear. CT CERVICAL SPINE FINDINGS No fracture. No spondylolisthesis. There is mild loss of disc height at C3-C4 with moderate loss disc height at C4-C5. Mild loss disc height is noted at C5-C6 and C6-C7. Uncovertebral spurring leads to moderate neural foraminal narrowing on the left at C3-C4-C4-C5. Facet degenerative changes are noted most evident at C7-T1. Bones are demineralized. Soft tissues are unremarkable.  Lung apices are clear. IMPRESSION: HEAD CT: No acute intracranial abnormalities. Mild atrophy and chronic microvascular ischemic change. CERVICAL CT:  No fracture or acute finding. Electronically Signed   By: Amie Portlandavid  Ormond M.D.   On: 03/15/2015 20:21   Ct Cervical Spine Wo Contrast  03/15/2015  CLINICAL DATA:  SOMETHING FELL OFF A SHELF AND HIT HER HARD IN THE HEADNOW HAS SEVERE PAIN BASE OF SKULL AND  ENTIRE NECK EXAM: CT HEAD WITHOUT CONTRAST CT CERVICAL SPINE WITHOUT CONTRAST TECHNIQUE: Multidetector CT imaging of the head and cervical spine was performed following the standard protocol without intravenous contrast. Multiplanar CT image reconstructions of the cervical spine were also generated. COMPARISON:  None. FINDINGS: CT HEAD FINDINGS Ventricles are normal configuration. There is ventricular and sulcal enlargement reflecting mild diffuse atrophy. No hydrocephalus. There are no parenchymal masses or mass effect. There is no evidence of a cortical infarct. Periventricular white matter hypoattenuation is noted consistent with mild chronic microvascular ischemic change. There are no extra-axial masses or abnormal fluid collections. There is no intracranial hemorrhage. No skull fracture. Visualized sinuses and mastoid air cells are clear. CT CERVICAL SPINE FINDINGS No fracture. No spondylolisthesis. There is mild loss of disc height at C3-C4 with moderate loss disc height at C4-C5. Mild loss disc height is noted at C5-C6 and C6-C7. Uncovertebral spurring leads to moderate neural foraminal narrowing on the left at C3-C4-C4-C5. Facet degenerative changes are noted most evident at C7-T1. Bones are demineralized. Soft tissues are unremarkable.  Lung apices are clear. IMPRESSION: HEAD CT: No acute intracranial abnormalities. Mild atrophy and chronic microvascular ischemic change. CERVICAL CT:  No fracture or acute finding. Electronically Signed   By: Amie Portlandavid  Ormond M.D.   On: 03/15/2015 20:21   Dg Pelvis Portable  03/15/2015  CLINICAL DATA:  Leg weakness while trying to get to the  bathroom. EXAM: PORTABLE PELVIS 1-2 VIEWS COMPARISON:  None. FINDINGS: Degenerative changes in the lower lumbar spine and in both hips. No evidence of acute fracture or dislocation of the pelvis. SI joints and symphysis pubis are not displaced. Sacral struts are symmetrical. No focal bone lesion or bone destruction. Calcified  phleboliths in the pelvis. IMPRESSION: No acute bony abnormalities. Electronically Signed   By: Burman Nieves M.D.   On: 03/15/2015 21:20    Microbiology: Recent Results (from the past 240 hour(s))  Urine culture     Status: None   Collection Time: 03/15/15  8:36 PM  Result Value Ref Range Status   Specimen Description URINE, CATHETERIZED  Final   Special Requests NONE  Final   Culture >=100,000 COLONIES/mL ESCHERICHIA COLI  Final   Report Status 03/18/2015 FINAL  Final   Organism ID, Bacteria ESCHERICHIA COLI  Final      Susceptibility   Escherichia coli - MIC*    AMPICILLIN >=32 RESISTANT Resistant     CEFAZOLIN <=4 SENSITIVE Sensitive     CEFTRIAXONE <=1 SENSITIVE Sensitive     CIPROFLOXACIN >=4 RESISTANT Resistant     GENTAMICIN <=1 SENSITIVE Sensitive     IMIPENEM <=0.25 SENSITIVE Sensitive     NITROFURANTOIN <=16 SENSITIVE Sensitive     TRIMETH/SULFA <=20 SENSITIVE Sensitive     AMPICILLIN/SULBACTAM 16 INTERMEDIATE Intermediate     PIP/TAZO <=4 SENSITIVE Sensitive     * >=100,000 COLONIES/mL ESCHERICHIA COLI     Labs: Basic Metabolic Panel:  Recent Labs Lab 03/15/15 1944 03/15/15 2008 03/16/15 0619  NA 141 140 137  K 4.3 4.3 3.8  CL 105 105 104  CO2 26  --  26  GLUCOSE 140* 136* 103*  BUN 29* 35* 26*  CREATININE 0.78 0.80 0.66  CALCIUM 9.2  --  8.4*   Liver Function Tests:  Recent Labs Lab 03/15/15 1944  AST 24  ALT 15  ALKPHOS 73  BILITOT 0.8  PROT 6.1*  ALBUMIN 3.7   No results for input(s): LIPASE, AMYLASE in the last 168 hours. No results for input(s): AMMONIA in the last 168 hours. CBC:  Recent Labs Lab 03/15/15 1944 03/15/15 2008 03/16/15 0619  WBC 7.7  --  6.5  NEUTROABS 5.6  --   --   HGB 13.5 14.6 11.7*  HCT 40.4 43.0 34.3*  MCV 93.5  --  93.2  PLT 208  --  190   Cardiac Enzymes:  Recent Labs Lab 03/16/15 0032 03/16/15 0619 03/16/15 1145  TROPONINI <0.03 <0.03 <0.03   BNP: BNP (last 3 results) No results for  input(s): BNP in the last 8760 hours.  ProBNP (last 3 results) No results for input(s): PROBNP in the last 8760 hours.  CBG:  Recent Labs Lab 03/15/15 1937  GLUCAP 140*       Signed:  Rhetta Mura  [facility] 03/18/2015, 10:08 AM

## 2015-03-18 NOTE — Care Management Note (Signed)
Case Management Note  Patient Details  Name: Abigail Thomas MRN: [id number] Date of Birth: 07-06-1945  Subjective/Objective:                  Spoke with patient's husband, offered home health services, family denied needing home health. CM asked spouse how much supervision was available to patient since SNF had been originally recommended. He stated that the family provides care for her 24/7.  CM offered to order family any DME if needed. Spouse stated that they would like a walker but [facility] because they did not want to wait for it to be delivered to room.    Action/Plan:  DC to home, no further CM needs identified.   Expected Discharge Date:  03/18/15               Expected Discharge Plan:  Home/Self Care  In-House Referral:  Clinical Social Work  Discharge planning Services  CM Consult  Post Acute Care Choice:    Choice offered to:  Spouse  DME Arranged:    DME Agency:     HH Arranged:    HH Agency:     Status of Service:  Completed, signed off  Medicare Important Message Given:    Date Medicare IM Given:    Medicare IM give by:    Date Additional Medicare IM Given:    Additional Medicare Important Message give by:     If discussed at Long Length of Stay Meetings, dates discussed:    Additional Comments:  Lawerance SabalDebbie Yavuz Kirby, RN 03/18/2015, 12:56 PM

## 2015-06-05 ENCOUNTER — Emergency Department (HOSPITAL_COMMUNITY)
Admission: EM | Admit: 2015-06-05 | Discharge: 2015-06-05 | Disposition: A | Discharge status: Home / Self Care | Payer: Medicare Other | Attending: Emergency Medicine | Admitting: Emergency Medicine

## 2015-06-05 ENCOUNTER — Emergency Department (HOSPITAL_COMMUNITY): Payer: Medicare Other

## 2015-06-05 ENCOUNTER — Encounter (HOSPITAL_COMMUNITY): Payer: Self-pay | Admitting: Emergency Medicine

## 2015-06-05 DIAGNOSIS — Z8669 Personal history of other diseases of the nervous system and sense organs: Secondary | ICD-10-CM | POA: Diagnosis not present

## 2015-06-05 DIAGNOSIS — S0181XA Laceration without foreign body of other part of head, initial encounter: Secondary | ICD-10-CM | POA: Insufficient documentation

## 2015-06-05 DIAGNOSIS — W01198A Fall on same level from slipping, tripping and stumbling with subsequent striking against other object, initial encounter: Secondary | ICD-10-CM | POA: Diagnosis not present

## 2015-06-05 DIAGNOSIS — Y9389 Activity, other specified: Secondary | ICD-10-CM | POA: Insufficient documentation

## 2015-06-05 DIAGNOSIS — IMO0002 Reserved for concepts with insufficient information to code with codable children: Secondary | ICD-10-CM

## 2015-06-05 DIAGNOSIS — Y92002 Bathroom of unspecified non-institutional (private) residence single-family (private) house as the place of occurrence of the external cause: Secondary | ICD-10-CM | POA: Insufficient documentation

## 2015-06-05 DIAGNOSIS — W19XXXA Unspecified fall, initial encounter: Secondary | ICD-10-CM

## 2015-06-05 DIAGNOSIS — Y998 Other external cause status: Secondary | ICD-10-CM | POA: Insufficient documentation

## 2015-06-05 MED ORDER — LIDOCAINE-EPINEPHRINE 2 %-1:100000 IJ SOLN
20.0000 mL | Freq: Once | INTRAMUSCULAR | Status: AC
  Administered 2015-06-05: 20 mL via INTRADERMAL
  Filled 2015-06-05: qty 1

## 2015-06-05 NOTE — Discharge Instructions (Signed)
Follow up with your PCP or urgent care in 5 days for suture removal.

## 2015-06-05 NOTE — ED Provider Notes (Signed)
CSN: [id number]     Arrival date & time 06/05/15  1718 History   First MD Initiated Contact with Patient 06/05/15 1728     Chief Complaint  Patient presents with  . Fall   HPI  [name] is a 70 year old female with PMHx of cerebral palsy presenting after a fall. Pt reports falling in her bathroom PTA. She has an unsteady gait at baseline due to her CP and typically uses a walker to ambulate. She reports not using her walker in the bathroom and becoming unsteady and fell forwards. She struck her head on the commode. Denies LOC. She is complaining of a laceration to her left forehead that is throbbing. EMS controlled bleeding by applying bandage en route. She has no other complaints today. She does not take blood thinners. Denies headache, dizziness, syncope, vision changes, neck pain, chest pain, back pain, abdominal pain, nausea, vomiting, or pain in the extremities. She lives at home with family supervision. Family did not accompany her to ED.   Past Medical History  Diagnosis Date  . CP (cerebral palsy) (HCC)    History reviewed. No pertinent past surgical history. No family history on file. Social History  Substance Use Topics  . Smoking status: Never Smoker   . Smokeless tobacco: None  . Alcohol Use: No   OB History    No data available     Review of Systems  Skin: Positive for wound.  All other systems reviewed and are negative.     Allergies  Review of patient's allergies indicates no known allergies.  Home Medications   Prior to Admission medications   Medication Sig Start Date End Date Taking? Authorizing Provider  aspirin-acetaminophen-caffeine (EXCEDRIN MIGRAINE) [phone] 9541250-250-65 MG tablet Take 1 tablet by mouth every 6 (six) hours as needed for headache.   Yes Historical Provider, MD  feeding supplement, ENSURE ENLIVE, (ENSURE ENLIVE) LIQD Take 237 mLs by mouth 2 (two) times daily between meals. Patient not taking: Reported on 06/05/2015 03/18/15   Rhetta MuraJai-Gurmukh Samtani,  MD  nitrofurantoin (MACRODANTIN) 100 MG capsule Take 1 capsule (100 mg total) by mouth 2 (two) times daily. Patient not taking: Reported on 06/05/2015 03/18/15   Rhetta MuraJai-Gurmukh Samtani, MD   BP 123/91 mmHg  Pulse 86  Temp(Src) 98.2 F (36.8 C) (Oral)  Resp 18  SpO2 97% Physical Exam  Constitutional: She is oriented to person, place, and time. She appears well-developed and well-nourished. No distress.  HENT:  Head: Normocephalic. Head is with laceration. Head is without raccoon's eyes and without Battle's sign.    Mouth/Throat: Oropharynx is clear and moist.  8 cm laceration noted to left forehead. Depth of approximately 0.5 cm. Wound edges approximate well. Wound irrigated and no foreign bodies visualized.   Eyes: Conjunctivae and EOM are normal. Pupils are equal, round, and reactive to light. Right eye exhibits no discharge. Left eye exhibits no discharge. No scleral icterus.  Neck: Normal range of motion. Neck supple.  No tenderness of the cervical spine. No cervical spine deformities.   Cardiovascular: Normal rate, regular rhythm and normal heart sounds.   Pulmonary/Chest: Effort normal and breath sounds normal. No respiratory distress.  Abdominal: Soft. There is no tenderness. There is no rebound and no guarding.  Musculoskeletal: Normal range of motion.  Moves all extremities without pain. No obvious edema or deformities  Neurological: She is alert and oriented to person, place, and time. No cranial nerve deficit. Coordination normal.  Alert and oriented. Cranial nerves 3-12 tested and intact.  No strength or sensory deficits.   Skin: Skin is warm and dry.  Psychiatric: She has a normal mood and affect. Her behavior is normal.  Nursing note and vitals reviewed.   ED Course  Procedures (including critical care time)  LACERATION REPAIR Performed by: Simeon Craft Authorized by: Simeon Craft Consent: Verbal consent obtained. Risks and benefits: risks, benefits and  alternatives were discussed Consent given by: patient Patient identity confirmed: provided demographic data Prepped and Draped in normal sterile fashion Wound explored  Laceration Location: left forehead  Laceration Length: 8 cm  No Foreign Bodies seen or palpated  Anesthesia: local infiltration  Local anesthetic: lidocaine 2% with epinephrine  Anesthetic total: 8 ml  Irrigation method: syringe Amount of cleaning: standard  Skin closure: 3-0 prolene  Number of sutures: 10  Technique: simple interrupted  Patient tolerance: Patient tolerated the procedure well with no immediate complications.   Labs Review Labs Reviewed - No data to display  Imaging Review Ct Head Wo Contrast  06/05/2015  CLINICAL DATA:  Fall in bathroom hitting head on toilet 2 hours prior, laceration of forehead. EXAM: CT HEAD WITHOUT CONTRAST CT CERVICAL SPINE WITHOUT CONTRAST TECHNIQUE: Multidetector CT imaging of the head and cervical spine was performed following the standard protocol without intravenous contrast. Multiplanar CT image reconstructions of the cervical spine were also generated. COMPARISON:  Head and cervical spine CT 03/15/2015 FINDINGS: CT HEAD FINDINGS No intracranial hemorrhage, mass effect, or midline shift. No hydrocephalus. The basilar cisterns are patent. No evidence of territorial infarct. No intracranial fluid collection. Stable atrophy and chronic small vessel ischemia from prior. Left supraorbital soft tissue laceration with soft tissue air, no radiopaque foreign body. No subjacent fracture. Calvarium is intact. Included paranasal sinuses and mastoid air cells are well aerated. CT CERVICAL SPINE FINDINGS Mild straightening of cervical lordosis is unchanged from prior exam. There is no fracture. Vertebral body heights are preserved. The dens is intact. There are no jumped or perched facets. Multilevel degenerative changes stable in degree from prior with disc space narrowing from C3-C4  through C6-C7 with associated endplate spurs. Findings most significant at C4-C5. Multilevel facet arthropathy. There is degenerative change at the C1-C2 articulation. Bony neural foraminal narrowing on the left at C3-C4 and C4-C5. No prevertebral soft tissue edema. IMPRESSION: 1. Left supraorbital soft tissue laceration. No radiopaque foreign body, fracture, or acute intracranial abnormality. 2. Stable degenerative change in the cervical spine. No acute fracture or subluxation. Electronically Signed   By: Rubye Oaks M.D.   On: 06/05/2015 18:19   Ct Cervical Spine Wo Contrast  06/05/2015  CLINICAL DATA:  Fall in bathroom hitting head on toilet 2 hours prior, laceration of forehead. EXAM: CT HEAD WITHOUT CONTRAST CT CERVICAL SPINE WITHOUT CONTRAST TECHNIQUE: Multidetector CT imaging of the head and cervical spine was performed following the standard protocol without intravenous contrast. Multiplanar CT image reconstructions of the cervical spine were also generated. COMPARISON:  Head and cervical spine CT 03/15/2015 FINDINGS: CT HEAD FINDINGS No intracranial hemorrhage, mass effect, or midline shift. No hydrocephalus. The basilar cisterns are patent. No evidence of territorial infarct. No intracranial fluid collection. Stable atrophy and chronic small vessel ischemia from prior. Left supraorbital soft tissue laceration with soft tissue air, no radiopaque foreign body. No subjacent fracture. Calvarium is intact. Included paranasal sinuses and mastoid air cells are well aerated. CT CERVICAL SPINE FINDINGS Mild straightening of cervical lordosis is unchanged from prior exam. There is no fracture. Vertebral body heights are preserved. The  dens is intact. There are no jumped or perched facets. Multilevel degenerative changes stable in degree from prior with disc space narrowing from C3-C4 through C6-C7 with associated endplate spurs. Findings most significant at C4-C5. Multilevel facet arthropathy. There is  degenerative change at the C1-C2 articulation. Bony neural foraminal narrowing on the left at C3-C4 and C4-C5. No prevertebral soft tissue edema. IMPRESSION: 1. Left supraorbital soft tissue laceration. No radiopaque foreign body, fracture, or acute intracranial abnormality. 2. Stable degenerative change in the cervical spine. No acute fracture or subluxation. Electronically Signed   By: Rubye Oaks M.D.   On: 06/05/2015 18:19   I have personally reviewed and evaluated these images and lab results as part of my medical decision-making.   EKG Interpretation None      MDM   Final diagnoses:  Fall  Laceration   Pt presenting with laceration to forehead after a fall. Pt has unsteady gait at baseline due to her CP which caused her fall. Denies LOC. She has no complaints today. Family is at bedside and report she is at her baseline. Non-focal neuro exam. 8 cm laceration to left forehead with good wound approximation. Depth of 0.5 cm.  Pressure irrigation performed. Wound explored and base of wound visualized in a bloodless field without evidence of foreign body. Laceration occurred < 8 hours prior to repair which was well tolerated. Td UTD. Head and neck CT negative. Pt has no comorbidities to effect normal wound healing. Pt discharged without antibiotics.  Discussed suture home care with patient and answered questions. Pt to follow-up for wound check and suture removal in 5 days; they are to return to the ED sooner for signs of infection. Return precautions given in discharge paperwork and discussed with pt at bedside. Pt stable for discharge    Alveta Heimlich, PA-C 06/05/15 2143

## 2015-06-05 NOTE — ED Notes (Signed)
Per [facility] pt from home, fall 90 min ago in bathroom , hit head against toilet, forehead lac above left eyebrow irregular edges. Bleeding controled. No loc, no neck pain nor back pain. Per pt she fell due to her cerebral palsy . Alert and oriented x 4. Difficulty hearing per ems.

## 2015-06-05 NOTE — ED Provider Notes (Signed)
Medical screening examination/treatment/procedure(s) were conducted as a shared visit with non-physician practitioner(s) and myself.  I personally evaluated the patient during the encounter.   EKG Interpretation None     Patient here after mechanical fall sustaining a laceration to her forehead. X-rays reviewed and neurological exam is stable. Patient stable for discharge to home  Lorre NickAnthony Daveigh Batty, MD 06/05/15 2006

## 2015-06-05 NOTE — ED Notes (Signed)
Bed: [id number] Expected date:  Expected time:  Means of arrival:  Comments: Ems - 70 year old, fall, head laceration

## 2015-06-20 ENCOUNTER — Ambulatory Visit: Payer: Self-pay

## 2015-06-27 ENCOUNTER — Emergency Department (INDEPENDENT_AMBULATORY_CARE_PROVIDER_SITE_OTHER)
Admission: EM | Admit: 2015-06-27 | Discharge: 2015-06-27 | Disposition: A | Discharge status: Home / Self Care | Payer: Medicare Other | Source: Home / Self Care | Attending: Family Medicine | Admitting: Family Medicine

## 2015-06-27 ENCOUNTER — Encounter (HOSPITAL_COMMUNITY): Payer: Self-pay | Admitting: Emergency Medicine

## 2015-06-27 DIAGNOSIS — Z4802 Encounter for removal of sutures: Secondary | ICD-10-CM

## 2015-06-27 NOTE — ED Provider Notes (Signed)
CSN: [id number]     Arrival date & time 06/27/15  1533 History   First MD Initiated Contact with Patient 06/27/15 1729     Chief Complaint  Patient presents with  . Suture / Staple Removal   (Consider location/radiation/quality/duration/timing/severity/associated sxs/prior Treatment) HPI Comments: 70 year old female comes to the urgent care for suture removal. She had fallen approximately 3 weeks ago and suffered a laceration to the forehead. She was seen in the emergency department in the laceration was sutured there. According to the chart is approximately 3 weeks ago. She is just now returning for suture removal. She has no complaints. No pain or tenderness.   Past Medical History  Diagnosis Date  . CP (cerebral palsy) (HCC)    History reviewed. No pertinent past surgical history. No family history on file. Social History  Substance Use Topics  . Smoking status: Never Smoker   . Smokeless tobacco: None  . Alcohol Use: No   OB History    No data available     Review of Systems  Constitutional: Negative.   Skin: Negative for color change and rash.       As per history of present illness  Neurological: Negative.   All other systems reviewed and are negative.   Allergies  Review of patient's allergies indicates no known allergies.  Home Medications   Prior to Admission medications   Medication Sig Start Date End Date Taking? Authorizing Provider  aspirin-acetaminophen-caffeine (EXCEDRIN MIGRAINE) [phone] MG tablet Take 1 tablet by mouth every 6 (six) hours as needed for headache.    Historical Provider, MD  feeding supplement, ENSURE ENLIVE, (ENSURE ENLIVE) LIQD Take 237 mLs by mouth 2 (two) times daily between meals. Patient not taking: Reported on 06/05/2015 03/18/15   Rhetta Mura, MD  nitrofurantoin (MACRODANTIN) 100 MG capsule Take 1 capsule (100 mg total) by mouth 2 (two) times daily. Patient not taking: Reported on 06/05/2015 03/18/15   Rhetta Mura,  MD   Meds Ordered and Administered this Visit  Medications - No data to display  BP 137/68 mmHg  Pulse 63  Temp(Src) 98.4 F (36.9 C) (Oral)  Resp 16  SpO2 100% No data found.   Physical Exam  Constitutional: She appears well-developed and well-nourished. No distress.  Neurological: She is alert. She exhibits normal muscle tone.  Skin: Skin is warm and dry.  Wound is healing well. No signs of infection. Minor erythema to the small portion of the wound. No lymphangitis. No drainage or purulence. There are 5 visible proline sutures to be removed.  Nursing note and vitals reviewed.   ED Course  .Suture Removal Date/Time: 06/27/2015 6:11 PM Performed by: Phineas Real, Fount Bahe Authorized by: Bradd Canary D Consent: Verbal consent obtained. Risks and benefits: risks, benefits and alternatives were discussed Consent given by: patient Patient understanding: patient states understanding of the procedure being performed Patient identity confirmed: verbally with patient and arm band Body area: head/neck Location details: forehead Wound Appearance: clean Sutures Removed: 5 Patient tolerance: Patient tolerated the procedure well with no immediate complications   (including critical care time)  Labs Review Labs Reviewed - No data to display  Imaging Review No results found.   Visual Acuity Review  Right Eye Distance:   Left Eye Distance:   Bilateral Distance:    Right Eye Near:   Left Eye Near:    Bilateral Near:         MDM   1. Encounter for removal of sutures    Keep clean and  dry, watch for infection. No more  abx oint. All sutures removed that can be seen.    Hayden Rasmussen, NP 06/27/15 [phone]

## 2015-06-27 NOTE — Discharge Instructions (Signed)

## 2015-06-27 NOTE — ED Notes (Signed)
Suture removal

## 2015-06-27 NOTE — ED Notes (Addendum)
Sutures placed in ed on 1/4.  10 sutures present per note

## 2016-11-08 IMAGING — CR DG PORTABLE PELVIS
1 series · 1 of 1 positions shown · non-contrast
Comparison: None.

CLINICAL DATA: Leg weakness while trying to get to the bathroom.

EXAM:
PORTABLE PELVIS 1-2 VIEWS

[AP]
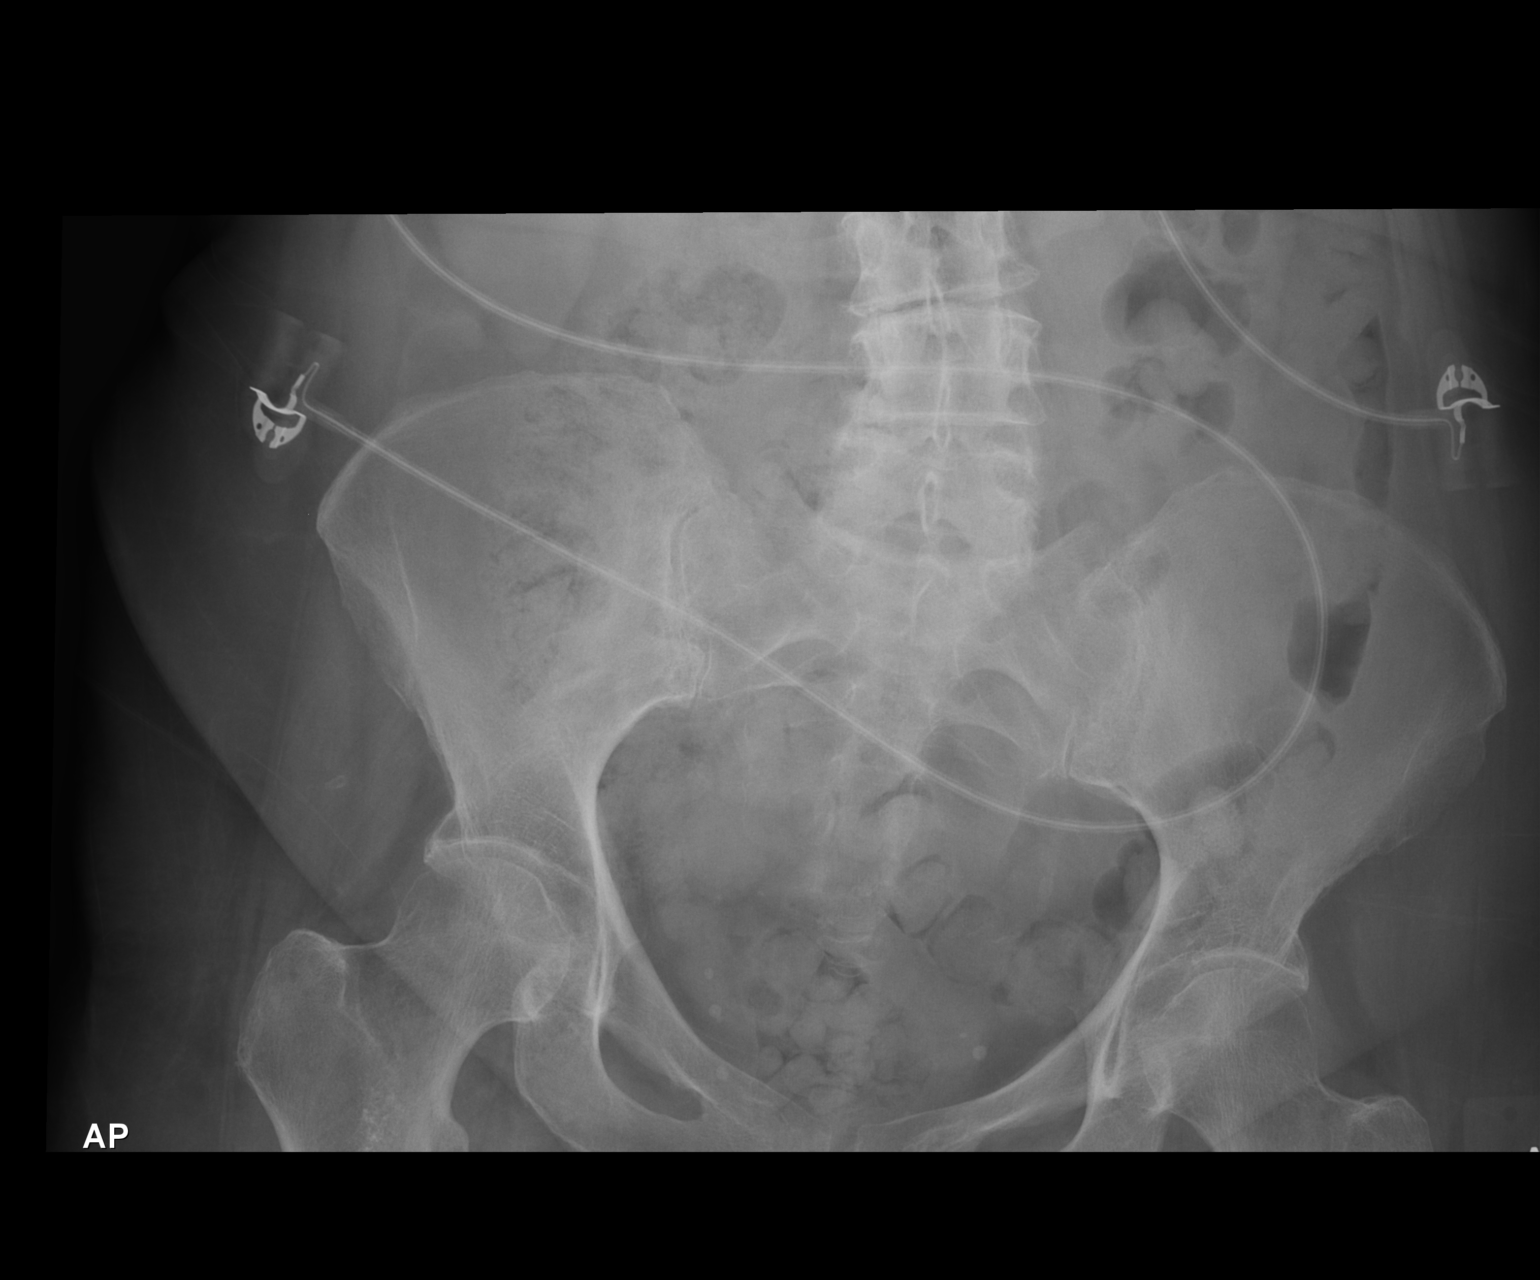

[1 of 1 positions shown; findings below may reference images not displayed]

FINDINGS: Degenerative changes in the lower lumbar spine and in both hips. No
evidence of acute fracture or dislocation of the pelvis. SI joints
and symphysis pubis are not displaced. Sacral struts are
symmetrical. No focal bone lesion or bone destruction. Calcified
phleboliths in the pelvis.
IMPRESSION: No acute bony abnormalities.

## 2016-11-09 IMAGING — CR DG KNEE 1-2V*R*
2 series · 2 of 2 positions shown · non-contrast
Comparison: None.

CLINICAL DATA: Acute pain and instability

EXAM:
RIGHT KNEE - 1-2 VIEW

[AP]
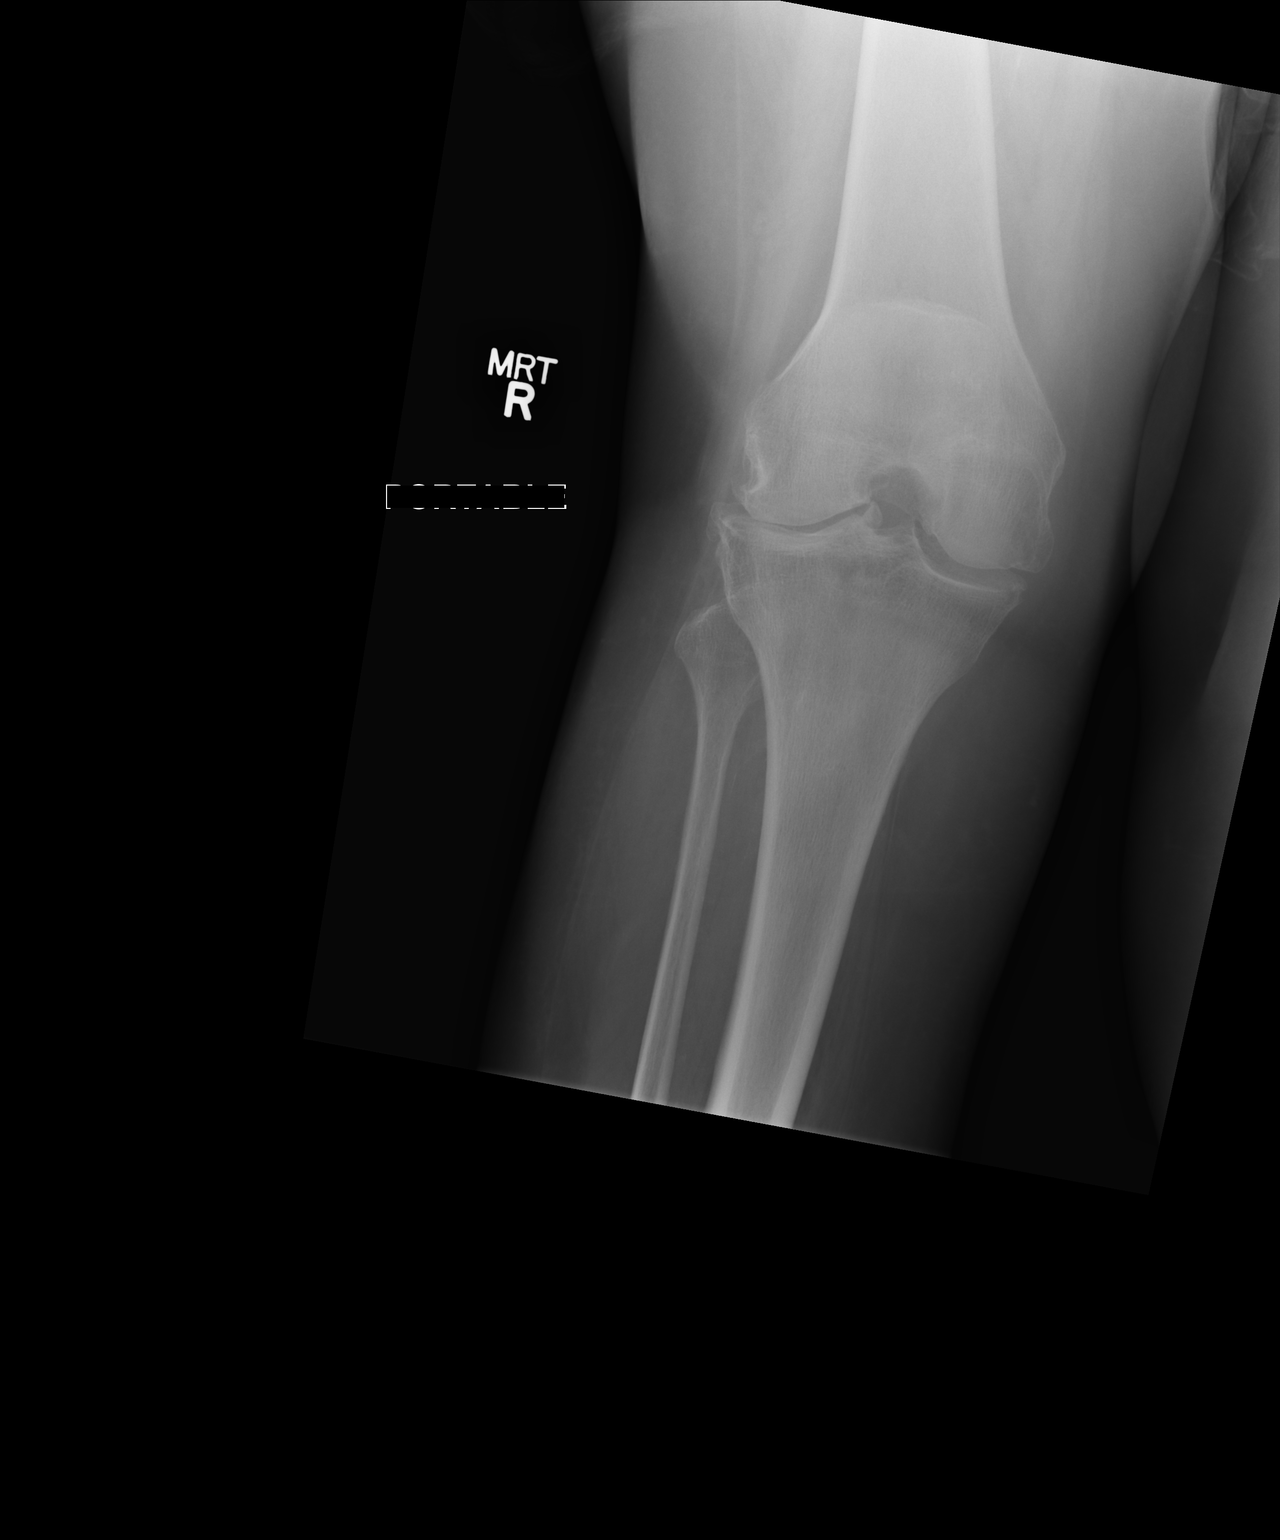

[lateral]
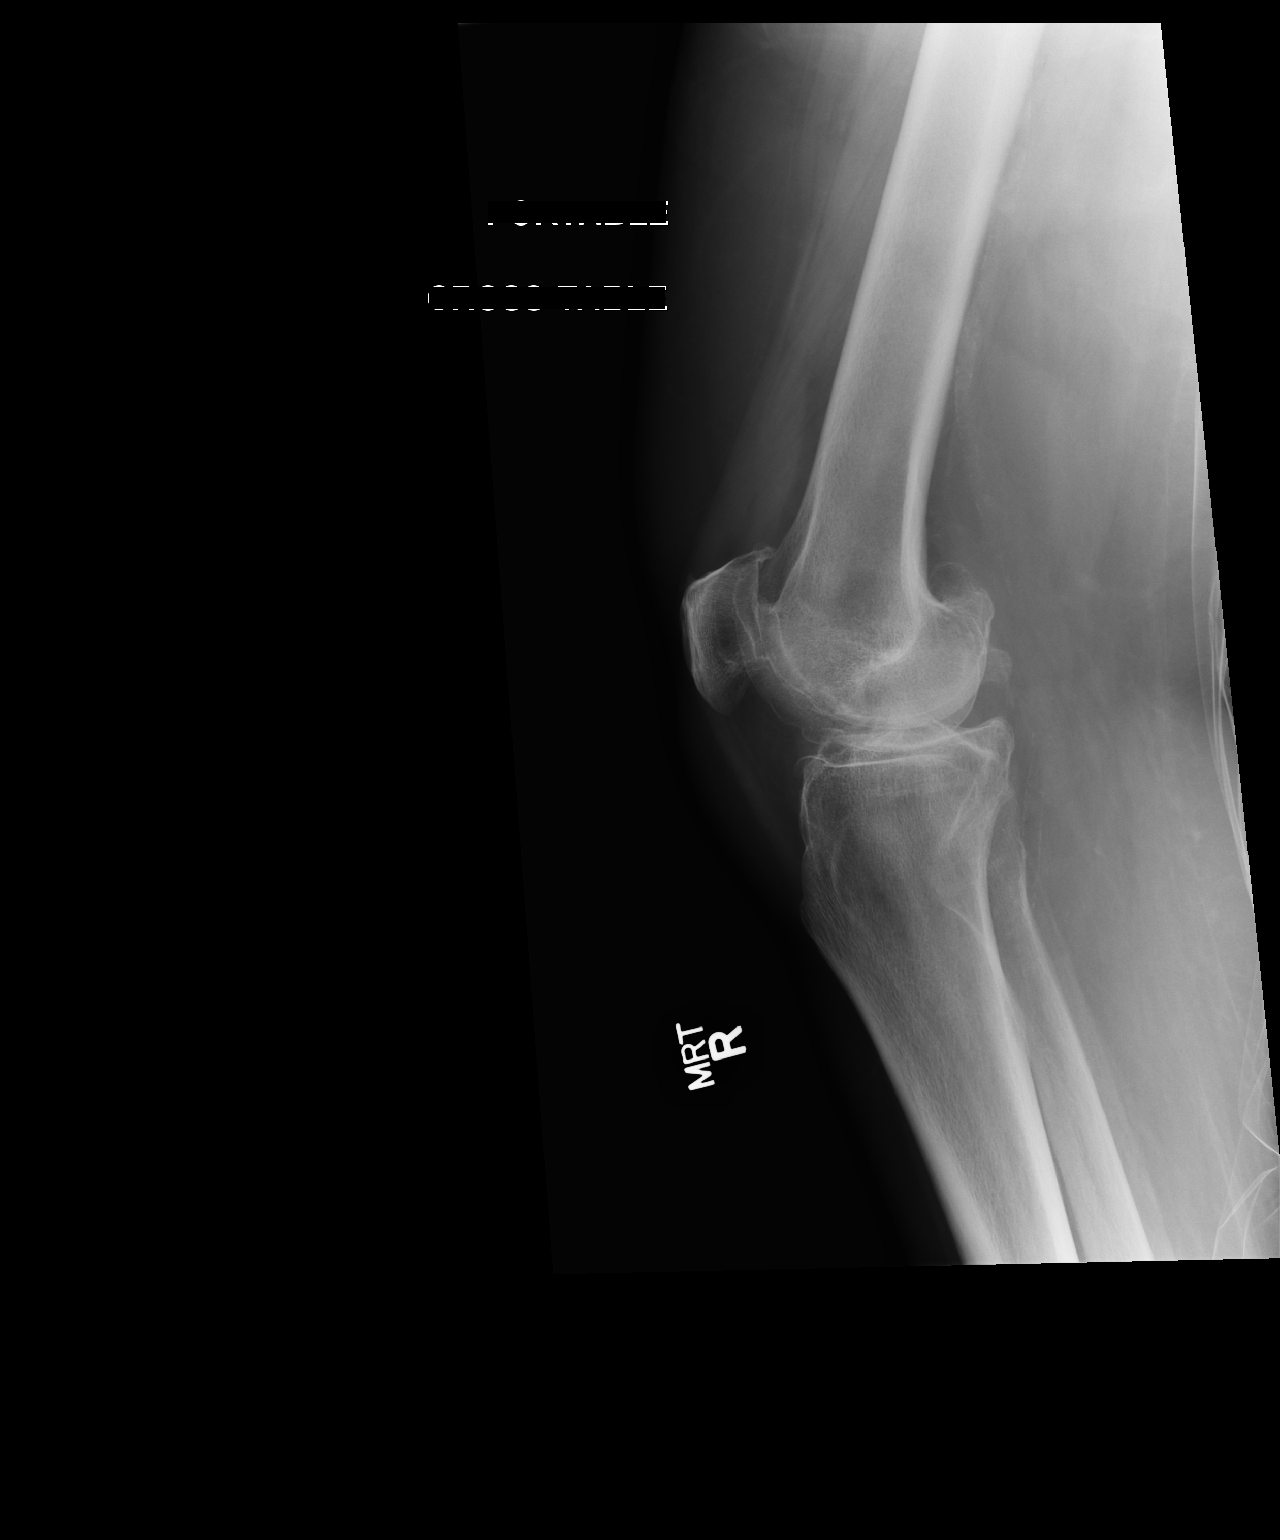

[2 of 2 positions shown; findings below may reference images not displayed]

FINDINGS: Two views of the right knee submitted. No acute fracture or
subluxation. There is diffuse narrowing of joint space most
significant laterally. There is spurring of lateral tibial plateau.
Spurring of medial and lateral femoral condyle. Significant
narrowing of patellofemoral joint space. Spurring of patella. Small
joint effusion.
IMPRESSION: No acute fracture or subluxation. Osteoarthritic changes as
described above.

## 2016-11-09 IMAGING — CR DG KNEE 1-2V*L*
2 series · 2 of 2 positions shown · non-contrast
Comparison: None.

CLINICAL DATA: Anterior knee pain and instability. History of
cerebral palsy. No trauma history submitted.

EXAM:
LEFT KNEE - 1-2 VIEW

[AP]
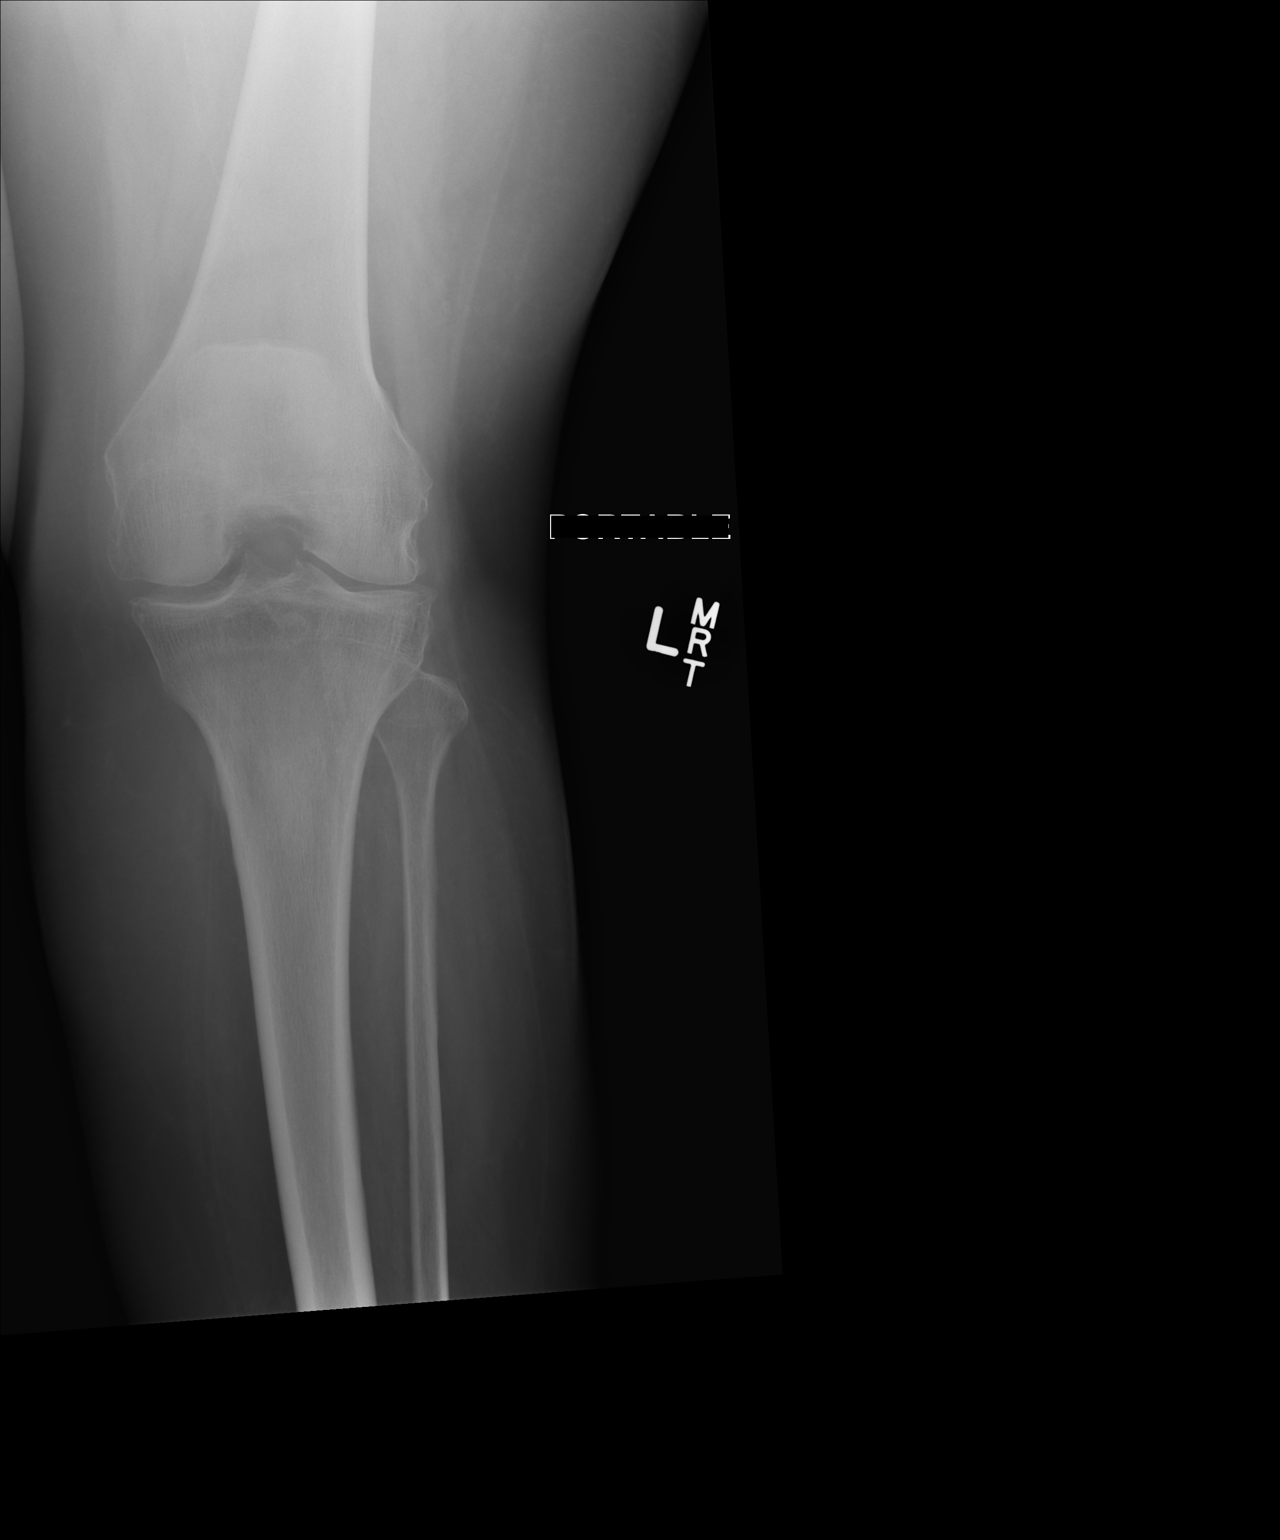

[lateral]
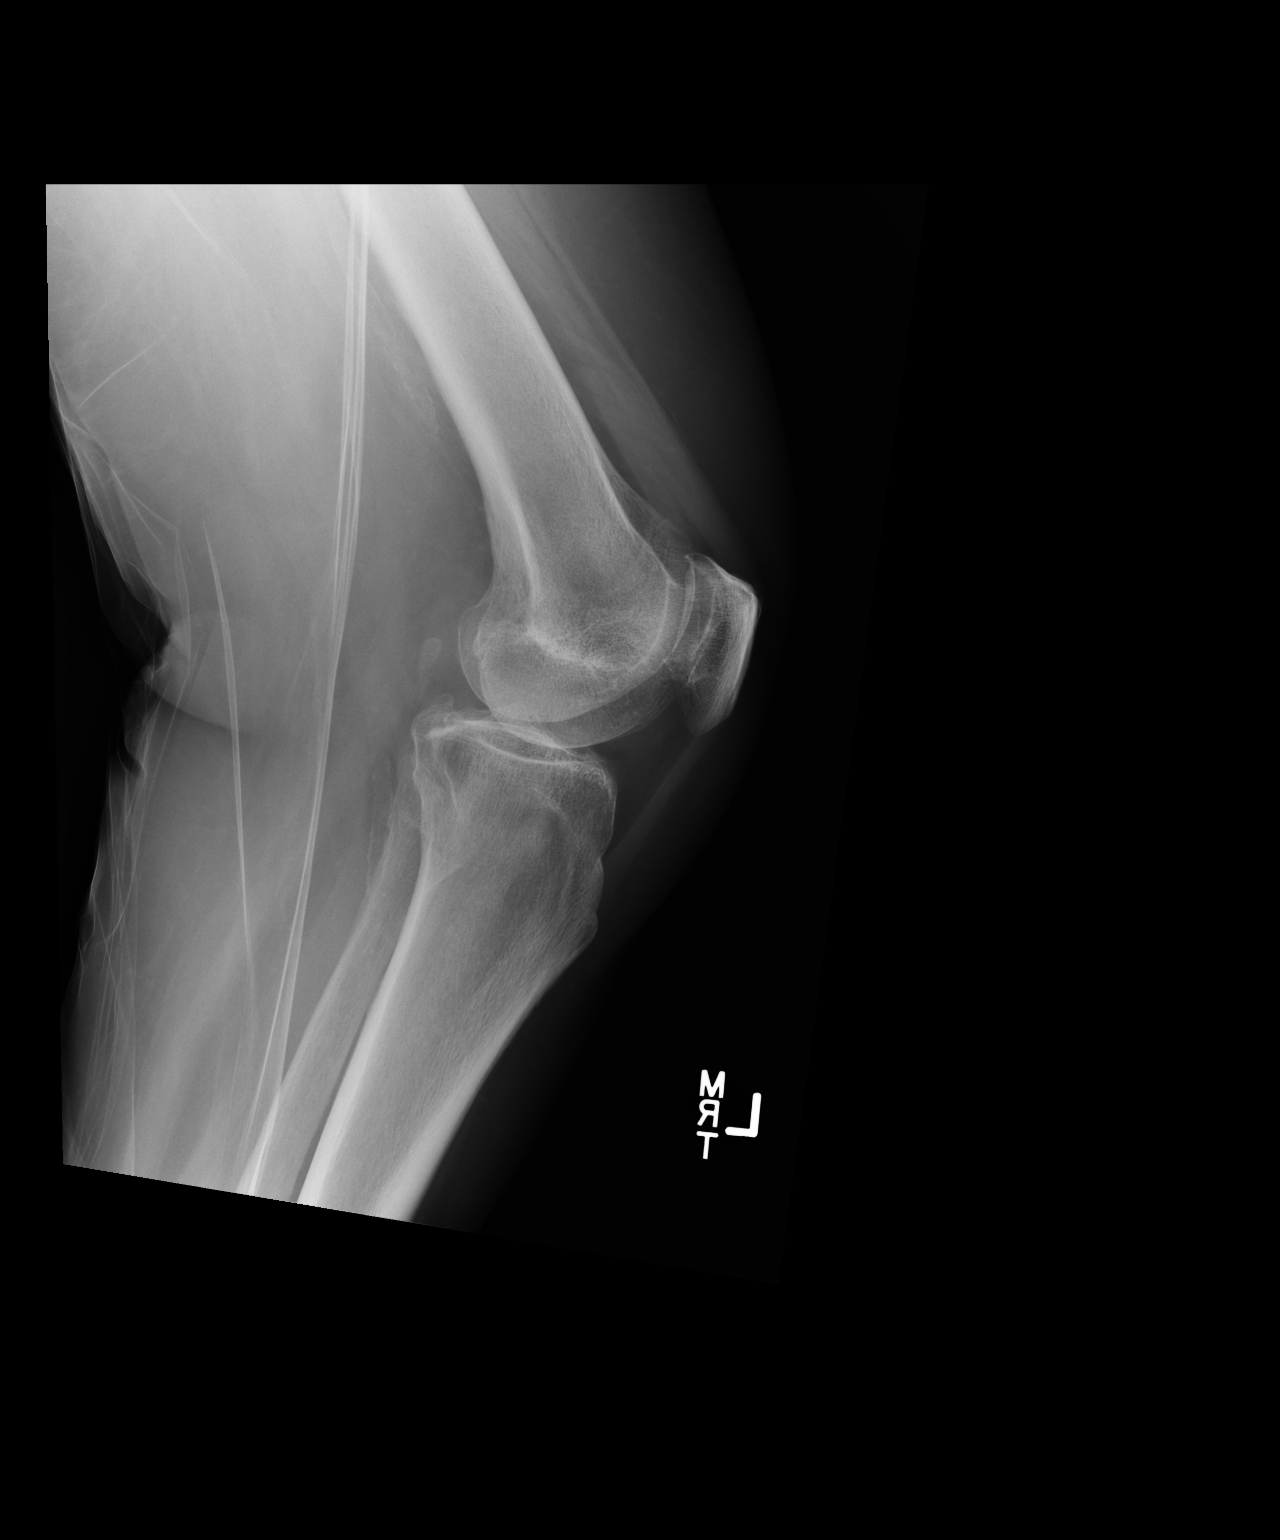

[2 of 2 positions shown; findings below may reference images not displayed]

FINDINGS: AP and lateral views. Three compartment osteoarthritis is Mild.
Joint space narrowing and subchondral sclerosis. No acute fracture
or dislocation. No joint effusion. Vascular calcifications.
IMPRESSION: Degenerative change, without acute osseous finding.

## 2023-04-09 ENCOUNTER — Inpatient Hospital Stay (HOSPITAL_COMMUNITY)
Admission: EM | Admit: 2023-04-09 | Discharge: 2023-04-11 | DRG: 101 | Disposition: A | Discharge status: Home Health | Payer: 59 | Attending: Family Medicine | Admitting: Family Medicine

## 2023-04-09 ENCOUNTER — Observation Stay (HOSPITAL_COMMUNITY): Payer: 59

## 2023-04-09 ENCOUNTER — Other Ambulatory Visit: Payer: Self-pay

## 2023-04-09 ENCOUNTER — Emergency Department (HOSPITAL_COMMUNITY): Payer: 59

## 2023-04-09 DIAGNOSIS — Z66 Do not resuscitate: Secondary | ICD-10-CM | POA: Diagnosis present

## 2023-04-09 DIAGNOSIS — R471 Dysarthria and anarthria: Secondary | ICD-10-CM | POA: Diagnosis present

## 2023-04-09 DIAGNOSIS — B9620 Unspecified Escherichia coli [E. coli] as the cause of diseases classified elsewhere: Secondary | ICD-10-CM | POA: Diagnosis present

## 2023-04-09 DIAGNOSIS — L8990 Pressure ulcer of unspecified site, unspecified stage: Secondary | ICD-10-CM | POA: Insufficient documentation

## 2023-04-09 DIAGNOSIS — F05 Delirium due to known physiological condition: Secondary | ICD-10-CM | POA: Diagnosis present

## 2023-04-09 DIAGNOSIS — G9389 Other specified disorders of brain: Secondary | ICD-10-CM | POA: Diagnosis present

## 2023-04-09 DIAGNOSIS — Z7401 Bed confinement status: Secondary | ICD-10-CM

## 2023-04-09 DIAGNOSIS — F03C2 Unspecified dementia, severe, with psychotic disturbance: Secondary | ICD-10-CM

## 2023-04-09 DIAGNOSIS — I10 Essential (primary) hypertension: Secondary | ICD-10-CM | POA: Diagnosis present

## 2023-04-09 DIAGNOSIS — Z79899 Other long term (current) drug therapy: Secondary | ICD-10-CM

## 2023-04-09 DIAGNOSIS — R32 Unspecified urinary incontinence: Secondary | ICD-10-CM | POA: Diagnosis present

## 2023-04-09 DIAGNOSIS — F02C2 Dementia in other diseases classified elsewhere, severe, with psychotic disturbance: Secondary | ICD-10-CM | POA: Diagnosis present

## 2023-04-09 DIAGNOSIS — N390 Urinary tract infection, site not specified: Secondary | ICD-10-CM | POA: Diagnosis present

## 2023-04-09 DIAGNOSIS — R54 Age-related physical debility: Secondary | ICD-10-CM | POA: Diagnosis present

## 2023-04-09 DIAGNOSIS — Z515 Encounter for palliative care: Secondary | ICD-10-CM

## 2023-04-09 DIAGNOSIS — G9340 Encephalopathy, unspecified: Secondary | ICD-10-CM | POA: Diagnosis present

## 2023-04-09 DIAGNOSIS — G309 Alzheimer's disease, unspecified: Secondary | ICD-10-CM | POA: Diagnosis present

## 2023-04-09 DIAGNOSIS — Z993 Dependence on wheelchair: Secondary | ICD-10-CM

## 2023-04-09 DIAGNOSIS — R569 Unspecified convulsions: Principal | ICD-10-CM

## 2023-04-09 DIAGNOSIS — R410 Disorientation, unspecified: Secondary | ICD-10-CM | POA: Diagnosis not present

## 2023-04-09 DIAGNOSIS — G809 Cerebral palsy, unspecified: Secondary | ICD-10-CM | POA: Diagnosis present

## 2023-04-09 DIAGNOSIS — F03C0 Unspecified dementia, severe, without behavioral disturbance, psychotic disturbance, mood disturbance, and anxiety: Secondary | ICD-10-CM | POA: Insufficient documentation

## 2023-04-09 LAB — COMPREHENSIVE METABOLIC PANEL
ALT: 10 U/L (ref 0–44)
AST: 17 U/L (ref 15–41)
Albumin: 3.3 g/dL — ABNORMAL LOW (ref 3.5–5.0)
Alkaline Phosphatase: 70 U/L (ref 38–126)
Anion gap: 10 (ref 5–15)
BUN: 26 mg/dL — ABNORMAL HIGH (ref 8–23)
CO2: 23 mmol/L (ref 22–32)
Calcium: 9 mg/dL (ref 8.9–10.3)
Chloride: 105 mmol/L (ref 98–111)
Creatinine, Ser: 0.77 mg/dL (ref 0.44–1.00)
GFR, Estimated: >60 mL/min (ref >60)
Glucose, Bld: 152 mg/dL — ABNORMAL HIGH (ref 70–99)
Potassium: 4 mmol/L (ref 3.5–5.1)
Sodium: 138 mmol/L (ref 135–145)
Total Bilirubin: 0.9 mg/dL (ref <1.2)
Total Protein: 6.1 g/dL — ABNORMAL LOW (ref 6.5–8.1)

## 2023-04-09 LAB — URINALYSIS, ROUTINE W REFLEX MICROSCOPIC
Bilirubin Urine: NEGATIVE
Glucose, UA: NEGATIVE mg/dL
Ketones, ur: NEGATIVE mg/dL
Nitrite: NEGATIVE
Protein, ur: 30 mg/dL — AB
Specific Gravity, Urine: 1.025 (ref 1.005–1.030)
pH: 6.5 (ref 5.0–8.0)

## 2023-04-09 LAB — URINALYSIS, MICROSCOPIC (REFLEX)

## 2023-04-09 LAB — CBC WITH DIFFERENTIAL/PLATELET
Abs Immature Granulocytes: 0.02 10*3/uL (ref 0.00–0.07)
Basophils Absolute: 0 10*3/uL (ref 0.0–0.1)
Basophils Relative: 1 %
Eosinophils Absolute: 0.1 10*3/uL (ref 0.0–0.5)
Eosinophils Relative: 2 %
HCT: 41.5 % (ref 36.0–46.0)
Hemoglobin: 14 g/dL (ref 12.0–15.0)
Immature Granulocytes: 1 %
Lymphocytes Relative: 36 %
Lymphs Abs: 1.4 10*3/uL (ref 0.7–4.0)
MCH: 32.6 pg (ref 26.0–34.0)
MCHC: 33.7 g/dL (ref 30.0–36.0)
MCV: 96.7 fL (ref 80.0–100.0)
Monocytes Absolute: 0.2 10*3/uL (ref 0.1–1.0)
Monocytes Relative: 6 %
Neutro Abs: 2.1 10*3/uL (ref 1.7–7.7)
Neutrophils Relative %: 54 %
Platelets: 202 10*3/uL (ref 150–400)
RBC: 4.29 MIL/uL (ref 3.87–5.11)
RDW: 12.1 % (ref 11.5–15.5)
WBC: 3.8 10*3/uL — ABNORMAL LOW (ref 4.0–10.5)
nRBC: 0 % (ref 0.0–0.2)

## 2023-04-09 LAB — CBG MONITORING, ED: Glucose-Capillary: 161 mg/dL — ABNORMAL HIGH (ref 70–99)

## 2023-04-09 LAB — ETHANOL: Alcohol, Ethyl (B): <10 mg/dL (ref <10)

## 2023-04-09 MED ORDER — VITAMIN D 25 MCG (1000 UNIT) PO TABS
1000.0000 [IU] | ORAL_TABLET | Freq: Every day | ORAL | Status: DC
Start: 2023-04-09 — End: 2023-04-11
  Administered 2023-04-10 – 2023-04-11 (×2): 1000 [IU] via ORAL
  Filled 2023-04-09 (×2): qty 1

## 2023-04-09 MED ORDER — LORAZEPAM 2 MG/ML IJ SOLN: 2.0000 mg | Freq: Once | INTRAMUSCULAR | Status: DC | PRN

## 2023-04-09 MED ORDER — LORAZEPAM 2 MG/ML IJ SOLN
1.0000 mg | INTRAMUSCULAR | Status: DC
  Filled 2023-04-09: qty 1

## 2023-04-09 MED ORDER — ONDANSETRON HCL 4 MG/2ML IJ SOLN
4.0000 mg | Freq: Once | INTRAMUSCULAR | Status: AC
  Administered 2023-04-09: 4 mg via INTRAVENOUS
  Filled 2023-04-09: qty 2

## 2023-04-09 MED ORDER — LEVETIRACETAM IN NACL 1500 MG/100ML IV SOLN
1500.0000 mg | Freq: Once | INTRAVENOUS | Status: AC
  Administered 2023-04-09: 1500 mg via INTRAVENOUS
  Filled 2023-04-09: qty 100

## 2023-04-09 MED ORDER — SODIUM CHLORIDE 0.9 % IV SOLN
1.0000 g | Freq: Once | INTRAVENOUS | Status: AC
Start: 2023-04-09 — End: 2023-04-09
  Administered 2023-04-09: 1 g via INTRAVENOUS
  Filled 2023-04-09: qty 10

## 2023-04-09 MED ORDER — ENOXAPARIN SODIUM 40 MG/0.4ML IJ SOSY
40.0000 mg | PREFILLED_SYRINGE | INTRAMUSCULAR | Status: DC
  Administered 2023-04-10 – 2023-04-11 (×2): 40 mg via SUBCUTANEOUS
  Filled 2023-04-09 (×2): qty 0.4

## 2023-04-09 MED ORDER — DOCUSATE SODIUM 50 MG/5ML PO LIQD
50.0000 mg | Freq: Every day | ORAL | Status: DC
Start: 2023-04-09 — End: 2023-04-11
  Administered 2023-04-10 – 2023-04-11 (×2): 50 mg via ORAL
  Filled 2023-04-09 (×2): qty 10

## 2023-04-09 NOTE — Procedures (Incomplete)
Patient Name: Abigail Thomas  MRN: [id number]  Epilepsy Attending: Charlsie Quest  Referring Physician/Provider: Jefferson Fuel, MD  Date: 04/09/2023 Duration:   Patient history: 77yo Fwith seizure like activity getting eeg to evaluate for seizure  Level of alertness: Awake, drowsy, sleep, comatose, lethargic ***  AEDs during EEG study: LEV  Technical aspects: This EEG study was done with scalp electrodes positioned according to the 10-20 International system of electrode placement. Electrical activity was reviewed with band pass filter of 1-70Hz , sensitivity of 7 uV/mm, display speed of 29mm/sec with a 60Hz  notched filter applied as appropriate. EEG data were recorded continuously and digitally stored.  Video monitoring was available and reviewed as appropriate.  Description: The posterior dominant rhythm consists of 9-10 Hz activity of moderate voltage (25-35 uV) seen predominantly in posterior head regions, symmetric and reactive to eye opening and eye closing. Drowsiness was characterized by attenuation of the posterior background rhythm. Sleep was characterized by vertex waves, sleep spindles (12 to 14 Hz), maximal frontocentral region.  There is an excessive amount of 15 to 18 Hz, 2-3 uV beta activity with irregular morphology distributed symmetrically and diffusely.   EEG showed continuous/intermittent generalized polymorphic sharply contoured 3 to 6 Hz theta-delta slowing.  EEG showed generalized periodic discharges with triphasic morphology at  Hz, more prominent when awake/stimulated.  Generalized Spike/Polyspikes/Sharp waves were noted in left/right frontal/temporal/parietal/occipital region.   Seizure was noted arising from left/right frontal/temporal/parietal/occipital region.  During seizure, patient was noted to.  Onset of seizure, total duration  Event button was pressed on at for .  Concomitant EEG before, during and after the event showed normal posterior dominant  rhythm, did not show any EEG changes suggest seizure.  Patient was noted to have episodes of brief sudden eye opening with whole body jerking every few seconds.  Concomitant EEG showed generalized polyspikes consistent with myoclonic seizures.  In between seizures EEG showed generalized background suppression.   Hyperventilation did not show any EEG change.  Physiologic photic driving was not seen during photic stimulation.  Hyperventilation and photic stimulation were not performed.     ABNORMALITY -Sharp wave, generalized, left/right frontal/temporal/parietal/occipital region.  -Spike,generalized, left/right frontal/temporal/parietal/occipital region.  -Polyspikes, generalized, left/right frontal/temporal/parietal/occipital region.  - Lateralized periodic discharges with overriding fast activity ( LPD +) left/right, maximal frontal/temporal/parietal/occipital region - Periodic discharges with triphasic morphology, generalized ( GPDs) - Intermittent slow, generalized - Continuous slow, generalized - Excessive beta, generalized    IMPRESSION: This study is within normal limits.  This study is suggestive of mild/moderate/severe diffuse encephalopathy, nonspecific etiology but likely related to sedation, toxic-metabolic etiology, anoxic/hypoxic brain injury This study is suggestive of cortical dysfunction arising from left/right frontal/temporal/parietal/occipital region, nonspecific etiology, likely secondary to underlying structural abnormality This study showed evidence of potential epileptogenicity arising from This study showed seizures arising from No seizures or epileptiform discharges were seen throughout the recording. However, only wakefulness and drowsiness were recorded. If suspicion for interictal activity remains a concern, a prolonged study including sleep should be considered.  The excessive beta activity seen in the background is most likely due to the effect of  benzodiazepine and is a benign EEG pattern. Patient was noted to have myoclonic seizures every few seconds.  Additionally there was evidence of severe to profound diffuse encephalopathy.  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  Dr. was notified.  A normal interictal EEG does not exclude nor support the diagnosis of epilepsy.   Abigail Thomas  Abigail Thomas

## 2023-04-09 NOTE — ED Provider Notes (Signed)
[place] EMERGENCY DEPARTMENT AT [facility] Provider Note   CSN: [id number] Arrival date & time: 04/09/23  1244     History  Chief Complaint  Patient presents with   Seizures    Abigail Thomas is a 77 y.o. female.  77 year old female with dementia and cerebral palsy who presents emergency department due to seizure.  History limited due to the fact that the patient is not speaking at this point in time.  EMS reports that she lives at home with her son and that she had a first-time seizure today.  They were able to obtain IV access.  No additional history able to be obtained at this time.  Son was eventually brought to the bedside.  Reports that today his mother had an episode where her eyes rolled in the back of her head and he heard strange puffing breathing noises.  Lasted for a brief period of time (less than 5 minutes) and then says that she was not talking afterwards.  Typically is verbal but only alert and oriented to self.  No history of seizures.  No alcohol use at all.  Denies any recent illnesses.       Home Medications Prior to Admission medications   Medication Sig Start Date End Date Taking? Authorizing Provider  aspirin-acetaminophen-caffeine (EXCEDRIN MIGRAINE) [phone] MG tablet Take 1 tablet by mouth every 6 (six) hours as needed for headache.   Yes [provider]  cholecalciferol (VITAMIN D3) 25 MCG (1000 UNIT) tablet Take 1,000 Units by mouth daily.   Yes [provider]  Docusate Sodium (COLACE PO) Take 1 tablet by mouth daily.   Yes [provider]      Allergies    Patient has no known allergies.    Review of Systems   Review of Systems  Physical Exam Updated Vital Signs BP (!) 139/103 (BP Location: Right Arm)   Pulse 80   Temp (!) 97 F (36.1 C) (Axillary)   Resp 18   Ht 5\' 7"  (1.702 m)   Wt 75.6 kg   SpO2 100%   BMI 26.10 kg/m  Physical Exam Vitals and nursing note reviewed.  Constitutional:       General: She is not in acute distress.    Appearance: She is well-developed.     Comments: Nonverbal but occasionally will groan.  Appears to be protecting airway.  HENT:     Head: Normocephalic and atraumatic.     Right Ear: External ear normal.     Left Ear: External ear normal.     Nose: Nose normal.  Eyes:     Extraocular Movements: Extraocular movements intact.     Conjunctiva/sclera: Conjunctivae normal.     Pupils: Pupils are equal, round, and reactive to light.     Comments: Pupils 5 mm bilaterally  Cardiovascular:     Rate and Rhythm: Normal rate and regular rhythm.     Heart sounds: No murmur heard. Pulmonary:     Effort: Pulmonary effort is normal. No respiratory distress.     Breath sounds: Normal breath sounds.  Abdominal:     General: Abdomen is flat. There is no distension.     Palpations: Abdomen is soft. There is no mass.     Tenderness: There is no abdominal tenderness. There is no guarding.  Musculoskeletal:     Cervical back: Normal range of motion and neck supple.     Right lower leg: No edema.     Left lower leg:  No edema.  Skin:    General: Skin is warm and dry.  Neurological:     Mental Status: She is alert.     Comments: Not cooperating with neurologic exam.  Pupils 5 mm bilaterally.  Cranial nerves appear grossly intact.  Patient able to resist movement in all 4 extremities.  Psychiatric:        Mood and Affect: Mood normal.     ED Results / Procedures / Treatments   Labs (all labs ordered are listed, but only abnormal results are displayed) Labs Reviewed  COMPREHENSIVE METABOLIC PANEL - Abnormal; Notable for the following components:      Result Value   Glucose, Bld 152 (*)    BUN 26 (*)    Total Protein 6.1 (*)    Albumin 3.3 (*)    All other components within normal limits  CBC WITH DIFFERENTIAL/PLATELET - Abnormal; Notable for the following components:   WBC 3.8 (*)    All other components within normal limits  URINALYSIS, ROUTINE W  REFLEX MICROSCOPIC - Abnormal; Notable for the following components:   APPearance CLOUDY (*)    Hgb urine dipstick TRACE (*)    Protein, ur 30 (*)    Leukocytes,Ua SMALL (*)    All other components within normal limits  URINALYSIS, MICROSCOPIC (REFLEX) - Abnormal; Notable for the following components:   Bacteria, UA MANY (*)    All other components within normal limits  CBG MONITORING, ED - Abnormal; Notable for the following components:   Glucose-Capillary 161 (*)    All other components within normal limits  CULTURE, BLOOD (ROUTINE X 2)  CULTURE, BLOOD (ROUTINE X 2)  URINE CULTURE  ETHANOL  RAPID URINE DRUG SCREEN, HOSP PERFORMED  BASIC METABOLIC PANEL  CBC  HEMOGLOBIN A1C    EKG EKG Interpretation Date/Time:  Friday April 09 2023 14:41:08 EST Ventricular Rate:  74 PR Interval:  190 QRS Duration:  104 QT Interval:  395 QTC Calculation: 439 R Axis:   3  Text Interpretation: Sinus rhythm Abnormal R-wave progression, early transition Confirmed by Vonita Moss [phone]) on 04/09/2023 3:06:58 PM  Radiology CT HEAD WO CONTRAST  Result Date: 04/09/2023 CLINICAL DATA:  first time seizure EXAM: CT HEAD WITHOUT CONTRAST TECHNIQUE: Contiguous axial images were obtained from the base of the skull through the vertex without intravenous contrast. RADIATION DOSE REDUCTION: This exam was performed according to the departmental dose-optimization program which includes automated exposure control, adjustment of the mA and/or kV according to patient size and/or use of iterative reconstruction technique. COMPARISON:  Head CT 06/05/2015 FINDINGS: Technically limited due to positioning and motion. Brain: No evidence of acute infarction, hemorrhage, hydrocephalus, extra-axial collection or mass lesion/mass effect. Progressive atrophy from 2017. Mild progression in periventricular white matter changes, nonspecific but typical of chronic small vessel ischemia. Vascular: No evident hyperdense vessel.  Skull: No fracture or focal lesion. Sinuses/Orbits: No acute finding. Other: None. IMPRESSION: 1. No acute intracranial abnormality. 2. Progressive atrophy and chronic small vessel ischemia from 2017. 3. Technically limited exam due to positioning and motion. Electronically Signed   By: Narda Rutherford M.D.   On: 04/09/2023 16:51    Procedures Procedures    Medications Ordered in ED Medications  levETIRAcetam (KEPPRA) IVPB 1500 mg/ 100 mL premix (has no administration in time range)  docusate (COLACE) 50 MG/5ML liquid 50 mg (has no administration in time range)  cholecalciferol (VITAMIN D3) 25 MCG (1000 UNIT) tablet 1,000 Units (has no administration in time range)  enoxaparin (LOVENOX)  injection 40 mg (has no administration in time range)  LORazepam (ATIVAN) injection 2 mg (has no administration in time range)  ondansetron (ZOFRAN) injection 4 mg (4 mg Intravenous Given 04/09/23 1420)  cefTRIAXone (ROCEPHIN) 1 g in sodium chloride 0.9 % 100 mL IVPB (1 g Intravenous New Bag/Given 04/09/23 1805)    ED Course/ Medical Decision Making/ A&P Clinical Course as of 04/09/23 1840  Fri Apr 09, 2023  1303 Attempted to contact son Leotis Shames without success [RP]  1645 Discussed with Dr. Selina Cooley from neurology.  With her history of cerebral palsy feels that she should be initiated on antiepileptics.  Recommends IV load of Keppra 500 mg and then to start her on Keppra twice daily afterwards.  Also recommends MRI brain with and without contrast and spot EEG. [RP]  1704 Signed out to Dr Anitra Lauth [RP]    Clinical Course User Index [RP] Rondel Baton, MD                                 Medical Decision Making Amount and/or Complexity of Data Reviewed Labs: ordered. Radiology: ordered.  Risk Prescription drug management. Decision regarding hospitalization.   CAIDANCE GELABERT is a 77 y.o. female with comorbidities that complicate the patient evaluation including dementia and cerebral palsy who  presented to the emergency department for seizure-like activity  Initial Ddx:  Seizure, ICH, stroke, encephalopathy, syncope  MDM/Course:  Patient presented to the emergency department with possible seizure.  Had an episode that was brief where she appeared to be smacking her lips and her eyes rolled back in her head.  Afterwards was confused.  On exam does not have any obvious focal neurologic deficits but is not complying with an exam because she is confused.  Does appear to have equal strength of all 4 extremities.  Does not have any current seizure-like activity or eye deviation.  Does not appear to have any neck rigidity.  Underwent CT head but unfortunately moved during it and so while it was technically limited did not reveal any obvious abnormality.  Was discussed with neurology who feels that with her underlying history of cerebral palsy is at increased risk of seizures so recommended starting her on Keppra at this time and to obtain MRI of the brain with and without contrast.  Will also obtain spot EEG and they will see the patient when admitted to the hospital.  Signed out to the oncoming physician awaiting admission call.  Patient also was found to have UTI and was treated with ceftriaxone.  This patient presents to the ED for concern of complaints listed in HPI, this involves an extensive number of treatment options, and is a complaint that carries with it a high risk of complications and morbidity. Disposition including potential need for admission considered.   Dispo: Admit to Floor  Additional history obtained from son Records reviewed Outpatient Clinic Notes The following labs were independently interpreted: Urinalysis and show urinary tract infection I independently reviewed the following imaging with scope of interpretation limited to determining acute life threatening conditions related to emergency care: CT Head and agree with the radiologist interpretation with the following  exceptions: none I personally reviewed and interpreted cardiac monitoring: normal sinus rhythm  I personally reviewed and interpreted the pt's EKG: see above for interpretation  I have reviewed the patients home medications and made adjustments as needed Consults: Neurology Social Determinants of health:  Elderly  Portions  of this note were generated with Scientist, clinical (histocompatibility and immunogenetics). Dictation errors may occur despite best attempts at proofreading.           Final Clinical Impression(s) / ED Diagnoses Final diagnoses:  Seizure-like activity [facility])  Disorientation    Rx / DC Orders ED Discharge Orders     None         Rondel Baton, MD 04/09/23 1840

## 2023-04-09 NOTE — ED Triage Notes (Signed)
PT BIB EMS from home, lives with son, who states the patient had a seizure, does not have a history of seizures, history of dementia.   136/60 Hr 98 96% RA Resp 18 SR on monitor  CBG 108 20 g Left hand

## 2023-04-09 NOTE — H&P (Cosign Needed Addendum)
Hospital Admission History and Physical Service Pager: [phone]  Patient name: Abigail Thomas Medical record number: [id number] Date of Birth: 19-Apr-1946 Age: 77 y.o. Gender: female  Primary Care Provider: Patient, No Pcp Per Consultants: Neurology Code Status: DNR/DNI Preferred Emergency Contact: [name], son, [phone]  Chief Complaint: Witnessed seizure-like activity  Assessment and Plan: Abigail Thomas is a 77 y.o. female with past history of cerebral palsy presenting with witnessed seizure-like activity. Differential for this patient's presentation of this includes seizure, syncope, nonepileptic seizures, hypoglycemia, migraine, electrolyte disturbance, intoxication, delirium.  Suspect seizure versus delirium in the setting of severe end-stage dementia.  Laboratory and imaging evaluation thus far grossly negative for metabolic causes of altered mental status.  Based on description of seizure episode it is unclear whether she had a true seizure, and per patient's son it is difficult to distinguish now whether she is altered from her baseline.  Assessment & Plan Seizure-like activity (HCC) Stable without clear signs of postictal state at this time.  As noted above it is difficult to discern patient's baseline mental status.  It is entirely possible she is currently in a postictal state.  Will continue to monitor with standard seizure protocol. Admit to family medicine teaching service (attending: Dr. Deirdre Priest), med telemetry Consulted neuro, follow up recommendations Received Keppra 15 mg IV loading dose Lorazepam 2 mg IV once as needed for seizure greater than 5 minutes EEG pending Telemetry monitoring Neurochecks once every 4 hours Seizure precautions N.p.o. until SLP evaluation Severe dementia with psychotic disturbance, unspecified dementia type (HCC) Dependent on son for all ADLs and IADLs.  It is unlikely her life expectancy is over 6 months. Delirium  precautions Fall precautions Consider palliative discussions   FEN/GI: N.p.o. until SLP evaluation VTE Prophylaxis: Enoxaparin 40  Disposition: Home pending workup for seizure.  History of Present Illness:  Abigail Thomas is a 77 y.o. female with past history of cerebral palsy presenting with witnessed seizure-like activity.   Per son had shaking and lip smacking at 11:30. No arms or legs shaking, but had rapid head shaking. States eyes rolled back in to head. Never had seizures before. No recent head injuries.   Son reports normal baseline is somewhat confused and has tangential conversations. Reports that patient has been having visual hallucinations for over a year. Son takes care of all of her ADLs.   In the ED, son provided history on witnessed seizure activity.  Consulted neuro for seizure workup.  Ordered EEG and MRI.  Given loading dose of Keppra.  UA showed evidence of UTI and was started on antibiotics and ordered blood cultures.  Review Of Systems: Per HPI with the following additions: Per family, patient has not reported any recent dysuria or had increased urinary frequency.  Pertinent Past Medical History: Cerebral palsy Severe dementia Hx of HTN  Pertinent Past Surgical History: No family history of seizures.   Pertinent Social History: Tobacco use: None Alcohol use: None Other Substance use: None Lives with son (helps with ADLs)  Pertinent Family History: No reported family history.  Important Outpatient Medications: Reports none except stool softener.  Remainder reviewed in medication history.   Objective: BP (!) 139/103 (BP Location: Right Arm)   Pulse 80   Temp 98 F (36.7 C) (Axillary)   Resp 18   Ht 5\' 7"  (1.702 m)   Wt 75.6 kg   SpO2 100%   BMI 26.10 kg/m  Exam: General: Lying in bed.  Alert but minimally interactive with  interviewer.   Eyes: No obvious abnormalities. ENTM: No obvious abnormalities.  No obvious tongue bite. Neck:  Supple. Cardiovascular: Normal rate and rhythm.  Normal S1-S2.  Normal results gallops. Respiratory: Lungs appear clear to auscultation but difficult to assess due to patient being unable to cooperate in exam. Gastrointestinal: No tenderness to palpation. MSK: No obvious abnormalities. Derm: No obvious skin lesions. Neuro: Alert, not oriented to person place or time, symmetric facies, rhythmic head jerking, speaks but thought process is not identifiable, moving all 4 extremities appropriately, bilateral medially deviated ankles Psych: Responding to internal stimuli.  Appears anxious.  Labs:  CBC BMET  Recent Labs  Lab 04/09/23 1310  WBC 3.8*  HGB 14.0  HCT 41.5  PLT 202   Recent Labs  Lab 04/09/23 1310  NA 138  K 4.0  CL 105  CO2 23  BUN 26*  CREATININE 0.77  GLUCOSE 152*  CALCIUM 9.0     Ethanol: Less than 10 UDS: Pending Urinalysis: Protein 30, many bacteria, 11-20 white blood cells Blood cultures: Pending   EKG: Normal sinus rhythm, no ischemic changes.   Imaging Studies Performed:  CT head without contrast: 1. No acute intracranial abnormality. 2. Progressive atrophy and chronic small vessel ischemia from 2017. 3. Technically limited exam due to positioning and motion.  MRI brain with and without contrast: Pending, likely to be motion graded  Meryl Dare, MD PGY-1 Psychiatry Resident 04/09/2023, 7:44 PM   FPTS Intern pager: [phone], text pages welcome Secure chat group [facility] Teaching Service   I have verified that the resident's  findings are accurately documented in the resident's note. I have made edits and changes where appropriate, and agree with plan.  Celine Mans, MD, PGY-2 [facility] 7:49 PM 04/09/2023

## 2023-04-10 ENCOUNTER — Observation Stay (HOSPITAL_COMMUNITY): Payer: 59

## 2023-04-10 ENCOUNTER — Encounter (HOSPITAL_COMMUNITY): Payer: Self-pay

## 2023-04-10 DIAGNOSIS — I10 Essential (primary) hypertension: Secondary | ICD-10-CM | POA: Diagnosis present

## 2023-04-10 DIAGNOSIS — Z79899 Other long term (current) drug therapy: Secondary | ICD-10-CM | POA: Diagnosis not present

## 2023-04-10 DIAGNOSIS — R569 Unspecified convulsions: Secondary | ICD-10-CM | POA: Diagnosis present

## 2023-04-10 DIAGNOSIS — R410 Disorientation, unspecified: Secondary | ICD-10-CM | POA: Diagnosis present

## 2023-04-10 DIAGNOSIS — R54 Age-related physical debility: Secondary | ICD-10-CM | POA: Diagnosis present

## 2023-04-10 DIAGNOSIS — F02C2 Dementia in other diseases classified elsewhere, severe, with psychotic disturbance: Secondary | ICD-10-CM | POA: Diagnosis present

## 2023-04-10 DIAGNOSIS — Z7189 Other specified counseling: Secondary | ICD-10-CM | POA: Diagnosis not present

## 2023-04-10 DIAGNOSIS — B9620 Unspecified Escherichia coli [E. coli] as the cause of diseases classified elsewhere: Secondary | ICD-10-CM | POA: Diagnosis present

## 2023-04-10 DIAGNOSIS — F03C0 Unspecified dementia, severe, without behavioral disturbance, psychotic disturbance, mood disturbance, and anxiety: Secondary | ICD-10-CM | POA: Insufficient documentation

## 2023-04-10 DIAGNOSIS — G9340 Encephalopathy, unspecified: Secondary | ICD-10-CM | POA: Diagnosis present

## 2023-04-10 DIAGNOSIS — Z993 Dependence on wheelchair: Secondary | ICD-10-CM | POA: Diagnosis not present

## 2023-04-10 DIAGNOSIS — G9389 Other specified disorders of brain: Secondary | ICD-10-CM | POA: Diagnosis present

## 2023-04-10 DIAGNOSIS — G309 Alzheimer's disease, unspecified: Secondary | ICD-10-CM | POA: Diagnosis present

## 2023-04-10 DIAGNOSIS — F05 Delirium due to known physiological condition: Secondary | ICD-10-CM | POA: Diagnosis present

## 2023-04-10 DIAGNOSIS — G809 Cerebral palsy, unspecified: Secondary | ICD-10-CM | POA: Diagnosis present

## 2023-04-10 DIAGNOSIS — R471 Dysarthria and anarthria: Secondary | ICD-10-CM | POA: Diagnosis present

## 2023-04-10 DIAGNOSIS — N390 Urinary tract infection, site not specified: Secondary | ICD-10-CM | POA: Diagnosis present

## 2023-04-10 DIAGNOSIS — L8990 Pressure ulcer of unspecified site, unspecified stage: Secondary | ICD-10-CM | POA: Insufficient documentation

## 2023-04-10 DIAGNOSIS — Z515 Encounter for palliative care: Secondary | ICD-10-CM | POA: Diagnosis not present

## 2023-04-10 DIAGNOSIS — Z7401 Bed confinement status: Secondary | ICD-10-CM | POA: Diagnosis not present

## 2023-04-10 DIAGNOSIS — R32 Unspecified urinary incontinence: Secondary | ICD-10-CM | POA: Diagnosis present

## 2023-04-10 DIAGNOSIS — Z66 Do not resuscitate: Secondary | ICD-10-CM | POA: Diagnosis present

## 2023-04-10 LAB — BASIC METABOLIC PANEL
Anion gap: 10 (ref 5–15)
BUN: 28 mg/dL — ABNORMAL HIGH (ref 8–23)
CO2: 23 mmol/L (ref 22–32)
Calcium: 9.3 mg/dL (ref 8.9–10.3)
Chloride: 106 mmol/L (ref 98–111)
Creatinine, Ser: 0.84 mg/dL (ref 0.44–1.00)
GFR, Estimated: >60 mL/min (ref >60)
Glucose, Bld: 102 mg/dL — ABNORMAL HIGH (ref 70–99)
Potassium: 4.1 mmol/L (ref 3.5–5.1)
Sodium: 139 mmol/L (ref 135–145)

## 2023-04-10 LAB — CBC
HCT: 37.6 % (ref 36.0–46.0)
Hemoglobin: 13.1 g/dL (ref 12.0–15.0)
MCH: 32.8 pg (ref 26.0–34.0)
MCHC: 34.8 g/dL (ref 30.0–36.0)
MCV: 94.2 fL (ref 80.0–100.0)
Platelets: 192 10*3/uL (ref 150–400)
RBC: 3.99 MIL/uL (ref 3.87–5.11)
RDW: 12 % (ref 11.5–15.5)
WBC: 7.2 10*3/uL (ref 4.0–10.5)
nRBC: 0 % (ref 0.0–0.2)

## 2023-04-10 LAB — HEMOGLOBIN A1C
Hgb A1c MFr Bld: 4.6 % — ABNORMAL LOW (ref 4.8–5.6)
Mean Plasma Glucose: 85.32 mg/dL

## 2023-04-10 NOTE — Progress Notes (Addendum)
Patient is having rhythmic intermittent jerking which gets worse when trying to take vitals signs. Was unable to take vitals with an automatic machine only able to obtain manually. Per report of ED admission nurse the movement of her upper extremities is the patiens baseline.   Neurologist  MD has seen patient at the bedside and believes that these movements of her upper extremities is due to her cerebral palsy not seizure like activity.   0400 unable to obtain vitals at this time due to patients movements.

## 2023-04-10 NOTE — Plan of Care (Signed)
Pt alert to self. No signs or symptoms of pain or discomfort. Incontinent of bowel and bladder. Children to bedside this shift. Will continue with current plan of care.  Problem: Education: Goal: Knowledge of General Education information will improve Description: Including pain rating scale, medication(s)/side effects and non-pharmacologic comfort measures Outcome: Progressing   Problem: Health Behavior/Discharge Planning: Goal: Ability to manage health-related needs will improve Outcome: Progressing   Problem: Clinical Measurements: Goal: Ability to maintain clinical measurements within normal limits will improve Outcome: Progressing Goal: Will remain free from infection Outcome: Progressing Goal: Diagnostic test results will improve Outcome: Progressing Goal: Respiratory complications will improve Outcome: Progressing Goal: Cardiovascular complication will be avoided Outcome: Progressing   Problem: Activity: Goal: Risk for activity intolerance will decrease Outcome: Progressing   Problem: Nutrition: Goal: Adequate nutrition will be maintained Outcome: Progressing   Problem: Coping: Goal: Level of anxiety will decrease Outcome: Progressing   Problem: Elimination: Goal: Will not experience complications related to bowel motility Outcome: Progressing Goal: Will not experience complications related to urinary retention Outcome: Progressing   Problem: Pain Management: Goal: General experience of comfort will improve Outcome: Progressing   Problem: Safety: Goal: Ability to remain free from injury will improve Outcome: Progressing   Problem: Skin Integrity: Goal: Risk for impaired skin integrity will decrease Outcome: Progressing

## 2023-04-10 NOTE — Plan of Care (Signed)
  Problem: Education: Goal: Knowledge of General Education information will improve Description: Including pain rating scale, medication(s)/side effects and non-pharmacologic comfort measures Outcome: Progressing   Problem: Pain Management: Goal: General experience of comfort will improve Outcome: Progressing   Problem: Elimination: Goal: Will not experience complications related to bowel motility Outcome: Progressing

## 2023-04-10 NOTE — Assessment & Plan Note (Signed)
Dependent on son for all ADLs and IADLs.  It is unlikely her life expectancy is over 6 months. Delirium precautions Fall precautions Consider palliative discussions

## 2023-04-10 NOTE — Hospital Course (Addendum)
Abigail Thomas is a 77 y.o.female with a history of cerebral palsy, severe dementia who was admitted to the [facility] Medicine Teaching Service at [facility] for seizure-like event. Her hospital course is detailed below:  Seizure-like activity Per son, pt had abnormal vocalizations and head movements followed by period of excess sleepiness. Loaded with Keppra on admission. Got ceftriaxone in the ED for UTI. Short term EEG showed nonspecific cerebral dysfunction, possible ictal-interictal pattern. Neurologic baseline unclear but pt does not have abnormal arm movements per son, continued to have these the day after admission. MRI was initially ordered but son preferred not to do this as it would require sedating the patient. Overnight EEG showed ***.  Other chronic conditions were medically managed with home medications and formulary alternatives as necessary (none)  PCP Follow-up Recommendations:

## 2023-04-10 NOTE — Consult Note (Signed)
NEUROLOGY CONSULT NOTE   Date of service: April 10, 2023 Patient Name: Abigail Thomas MRN:  [id number] DOB:  [birth year] Chief Complaint: "First time seizure" Requesting Provider: Carney Living, MD  History of Present Illness  Abigail Thomas is a 77 y.o. female with hx of Cerebral palsy, dementia p/w first time seizure like episode. No prior hx of seizures.  Episode witnessed by son but unfortunately, unable to get in touch with him overnight. Patient at baseline, has dementia and oriented to self only and she is unable to provide much history. History is obtained from chart review.  Per chart review, had an episode with eyes rolled back, strange puffing/breathing noises which lasted less than 5 mins and then was not talking afterwards. Per H and P, had rapid head shaking with eyes rolled back but no arms or legs shaking.  rEEG and MRI Brain ordered but not completed yet. She was loaded with Keppra 1500mg  IV once.   ROS  Unable to ascertain due to severe dementia at baseline.  Past History   Past Medical History:  Diagnosis Date   CP (cerebral palsy) (HCC)     No past surgical history on file.  Family History: No family history on file.  Social History  reports that she has never smoked. She does not have any smokeless tobacco history on file. She reports that she does not drink alcohol and does not use drugs.  No Known Allergies  Medications   Current Facility-Administered Medications:    cholecalciferol (VITAMIN D3) 25 MCG (1000 UNIT) tablet 1,000 Units, 1,000 Units, Oral, Daily, Gilman Buttner, Artie, MD   docusate (COLACE) 50 MG/5ML liquid 50 mg, 50 mg, Oral, Daily, Gilman Buttner, Artie, MD   enoxaparin (LOVENOX) injection 40 mg, 40 mg, Subcutaneous, Q24H, Gilman Buttner, Artie, MD   LORazepam (ATIVAN) injection 2 mg, 2 mg, Intravenous, Once PRN, Meryl Dare, MD  Vitals   Vitals:   04/09/23 1907 04/09/23 2105 04/09/23 2201 04/09/23 2315  BP:  132/86  (!) 162/80   Pulse:  93  76  Resp:   20   Temp: 98 F (36.7 C) 99.5 F (37.5 C) 99 F (37.2 C) 100 F (37.8 C)  TempSrc: Axillary Axillary Axillary Oral  SpO2:  93% 92% 95%  Weight:   64.5 kg   Height:   5\' 7"  (1.702 m)     Body mass index is 22.27 kg/m.  Physical Exam   General: Laying comfortably in bed; in no acute distress. Appears frail. HENT: Normal oropharynx and mucosa. Normal external appearance of ears and nose.  Neck: Supple, no pain or tenderness  CV: No JVD. No peripheral edema.  Pulmonary: Symmetric Chest rise. Normal respiratory effort.  Abdomen: Soft to touch, non-tender.  Ext: No cyanosis, edema, or deformity  Skin: No rash. Normal palpation of skin.   Musculoskeletal: Normal digits and nails by inspection. No clubbing.   Neurologic Examination  Mental status/Cognition: is asleep, opens eyes to loud voice/vigorous tactile stimulation and makes brief eye contact before going back to sleep. Oriented to self Speech/language: dysarthric and at times, not making sense. Goes right back to sleep. Cranial nerves:   CN II Pupils equal and reactive to light, makes brief eye contact on left and right.   CN III,IV,VI Makes eye contact on left and right   CN V normal sensation in V1, V2, and V3 segments bilaterally    CN VII Symmetric facial movements.   CN VIII normal hearing to speech   CN IX &  X Protecting her airway   CN XI Turns head left to right   CN XII midline tongue protrusion   Motor:  Muscle bulk: poor, tone increased slightly in all extremities. Noted to have choreiform movements that seem to be baseline for her. Moves BL upper extremities spontaneously and antigravity. Withdraws BL lower extremities to proximal pinch.  Sensation:  Light touch    Pin prick Intact throughout   Temperature    Vibration   Proprioception    Coordination/Complex Motor:  - Finger to Nose unable to get her to do. - Heel to shin unable to get her to do - Gait:  deferred.  Labs/Imaging/Neurodiagnostic studies   CBC:  Recent Labs  Lab 05-08-2023 1310  WBC 3.8*  NEUTROABS 2.1  HGB 14.0  HCT 41.5  MCV 96.7  PLT 202    Basic Metabolic Panel:  Lab Results  Component Value Date   NA 138 08-May-2023   K 4.0 2023-05-08   CO2 23 05-08-23   GLUCOSE 152 (H) 05/08/23   BUN 26 (H) 05-08-23   CREATININE 0.77 2023-05-08   CALCIUM 9.0 2023/05/08   GFRNONAA >60 May 08, 2023   GFRAA >60 03/16/2015    Lipid Panel: No results found for: "LDLCALC"  HgbA1c:  Lab Results  Component Value Date   HGBA1C 5.4 03/16/2015    Urine Drug Screen:     Component Value Date/Time   LABOPIA NONE DETECTED 03/15/2015 2036   COCAINSCRNUR NONE DETECTED 03/15/2015 2036   LABBENZ NONE DETECTED 03/15/2015 2036   AMPHETMU NONE DETECTED 03/15/2015 2036   THCU NONE DETECTED 03/15/2015 2036   LABBARB NONE DETECTED 03/15/2015 2036     Alcohol Level     Component Value Date/Time   ETH <10 08-May-2023 1310    INR  Lab Results  Component Value Date   INR 1.07 03/15/2015    APTT  Lab Results  Component Value Date   APTT 33 03/15/2015    AED levels: No results found for: "PHENYTOIN", "ZONISAMIDE", "LAMOTRIGINE", "LEVETIRACETA"    CT Head without contrast(Personally reviewed): CTH was negative for a large hypodensity concerning for a large territory infarct or hyperdensity concerning for an ICH  MRI Brain(Personally reviewed): No acute abnormality  Neurodiagnostics rEEG:  pending  Impression   Abigail Thomas is a 77 y.o. female with severe dementia cerebral palsy with baseline irregular choreiform movements in BL upper extremities currently admitted with  seizure like spell. I was unable to get in touch with her son overnight to get a description of the event. The noted description in chart with head movements without any movements in arms and legs is somewhat atypical for seizures.  Recommendations  - I would recommend reaching out to son  in AM to get a detailed description of the event. I would also recommend getting MRI Brain and rEEG and if they are negative for seizures and the episode is not very concerning for seizure, would hold off on further AEDs. ______________________________________________________________________    Welton Flakes [facility]

## 2023-04-10 NOTE — Procedures (Signed)
History: 77 year old female being evaluated for intermittent jerking.  Sedation: None  Patient State: Awake and drowsy  Technique: This EEG was acquired with electrodes placed according to the International 10-20 electrode system (including Fp1, Fp2, F3, F4, C3, C4, P3, P4, O1, O2, T3, T4, T5, T6, A1, A2, Fz, Cz, Pz). The following electrodes were missing or displaced: none.   Background: The background is fairly disorganized with generalized irregular high-voltage delta and theta activities with admixed generalized frontally predominant sharply contoured discharges with triphasic morphology.  There is also excessive overlying generalized beta range activities.  At times, the theta range activity does become quite rhythmic, but there is no definite evolution.  Photic stimulation: Physiologic driving is not performed  EEG Abnormalities: 1) triphasic waves 2) generalized irregular slow activity   Clinical Interpretation: This EEG is most consistent with a generalized nonspecific cerebral dysfunction (encephalopathy), however in the correct clinical context, it could be possible that this represents a pattern on the ictal-interictal continuum and consideration to benzodiazepine challenge or longer monitoring to look for sleep/wake changes could be considered.   There was no definite seizure or seizure predisposition recorded on this study.   Ritta Slot, MD [facility] [phone]  If 7pm- 7am, please page neurology on call as listed in AMION.

## 2023-04-10 NOTE — Progress Notes (Signed)
vLTM started all impedances below 10kohms  Atrium to monitor

## 2023-04-10 NOTE — Plan of Care (Signed)
FMTS Brief Progress Note  S:Patient seen at bedside for nighttime rounds.     O: BP (!) 162/80   Pulse 76   Temp 100 F (37.8 C) (Oral)   Resp 20   Ht 5\' 7"  (1.702 m)   Wt 64.5 kg   SpO2 95%   BMI 22.27 kg/m    Gen: Sleeping in bed in no acute distress Resp: chest rising symmetrically, no iWOB on RA Neuro: does have rhythmic intermittent jerking of head Moving all extremities well.  A/P: Seizure-like activity Received Keppra loading dose, awaiting further neurology recommendations, getting EEG.    Plan per H&P  Levin Erp, MD 04/10/2023, 12:55 AM PGY-3, [place] [facility] Night Resident  Please page [phone] with questions.

## 2023-04-10 NOTE — Assessment & Plan Note (Addendum)
Possible seizure at home. May have had underlying UTI that triggered this event. Given severe dementia and family wishes, deferring invasive workup with MRI as this would likely require pharmacologic sedation. Pending EEG results Pending neuro recommendations and further discussion with family to determine need for long term Keppra

## 2023-04-10 NOTE — Evaluation (Signed)
Clinical/Bedside Swallow Evaluation Patient Details  Name: Abigail Thomas MRN: [id number] Date of Birth: 26-Nov-1945  Today's Date: 04/10/2023 Time: SLP Start Time (ACUTE ONLY): 0847 SLP Stop Time (ACUTE ONLY): 0859 SLP Time Calculation (min) (ACUTE ONLY): 12 min  Past Medical History:  Past Medical History:  Diagnosis Date   CP (cerebral palsy) (HCC)    Past Surgical History: History reviewed. No pertinent surgical history. HPI:  Abigail Thomas is a 77 y.o. female with past history of cerebral palsy presenting with witnessed seizure-like activity. Differential for this patient's presentation of this includes seizure, syncope, nonepileptic seizures, hypoglycemia, migraine, electrolyte disturbance, intoxication, delirium.  Suspect seizure versus delirium in the setting of severe end-stage dementia.  Laboratory and imaging evaluation thus far grossly negative for metabolic causes of altered mental status.  Based on description of seizure episode it is unclear whether she had a true seizure, and per patient's son it is difficult to distinguish now whether she is altered from her baseline.    Assessment / Plan / Recommendation  Clinical Impression   Pt received upright in bed with forward leaning posture. She is alert with eyes closed and mumbling unintelligibly. She is unable to follow directions and needed max verbal and tactile cues to accept PO trials due to poor bolus awareness.  Cues included gently brushing bottom lip with spoon, cup lip, and straw.  As trials progressed, her bolus awareness improved and oral acceptance improved.   Pt presents with a moderate oral dysphagia per results of clinical swallow assessment completed today. No immediate concerns for pharyngeal dysphagia with limited consistencies tried today.  PO trials limited to thin liquids by spoon, cup, straw and applesauce (~3-4 oz). Pt able to achieve complete oral clearance of PO trials once oral acceptance and bolus  awareness improved with cues given above. Pharyngeal swallow initiation appeared prompt with laryngeal elevation noted. No overt or subtle s/s of aspiration observed. Solids were not attempted this session due to oral deficits previously described.   Recommend initiation of a full liquids, thin liquids diet today. Recommend PO meds crushed in applesauce or ice cream.   Plan: SLP will follow up to assess diet tolerance and modify diet as indicated. Baseline diet is unknown at this time.   SLP Visit Diagnosis: Dysphagia, oral phase (R13.11)    Aspiration Risk  Moderate aspiration risk    Diet Recommendation Thin liquid (Full liquids)    Liquid Administration via: Cup;Straw Medication Administration: Crushed with puree or ice cream Supervision: Full supervision/cueing for compensatory strategies;Staff to assist with self feeding Compensations: Minimize environmental distractions;Slow rate;Small sips/bites Postural Changes: Seated upright at 90 degrees    Other  Recommendations Oral Care Recommendations: Oral care BID    Recommendations for follow up therapy are one component of a multi-disciplinary discharge planning process, led by the attending physician.  Recommendations may be updated based on patient status, additional functional criteria and insurance authorization.  Follow up Recommendations  (TBD)      Assistance Recommended at Discharge    Functional Status Assessment Patient has had a recent decline in their functional status and/or demonstrates limited ability to make significant improvements in function in a reasonable and predictable amount of time  Frequency and Duration min 1 x/week  1 week       Prognosis Prognosis for improved oropharyngeal function: Fair Barriers to Reach Goals: Cognitive deficits;Severity of deficits      Swallow Study   General Date of Onset: 04/09/23 HPI: Abigail Thomas is  a 77 y.o. female with past history of cerebral palsy presenting  with witnessed seizure-like activity. Differential for this patient's presentation of this includes seizure, syncope, nonepileptic seizures, hypoglycemia, migraine, electrolyte disturbance, intoxication, delirium.  Suspect seizure versus delirium in the setting of severe end-stage dementia.  Laboratory and imaging evaluation thus far grossly negative for metabolic causes of altered mental status.  Based on description of seizure episode it is unclear whether she had a true seizure, and per patient's son it is difficult to distinguish now whether she is altered from her baseline. Type of Study: Bedside Swallow Evaluation Previous Swallow Assessment: none per chart Diet Prior to this Study: NPO Temperature Spikes Noted: No Respiratory Status: Room air History of Recent Intubation: No Behavior/Cognition: Alert;Requires cueing;Confused;Doesn't follow directions Oral Cavity Assessment: Dry Oral Care Completed by SLP: No (unable to complete) Oral Cavity - Dentition: Dentures, not available;Edentulous Vision: Impaired for self-feeding Self-Feeding Abilities: Total assist Patient Positioning: Upright in bed Baseline Vocal Quality: Normal Volitional Cough: Cognitively unable to elicit Volitional Swallow: Unable to elicit    Oral/Motor/Sensory Function Overall Oral Motor/Sensory Function:  (unable to assess)   Ice Chips Ice chips: Not tested   Thin Liquid Thin Liquid: Impaired Presentation: Cup;Straw;Spoon Oral Phase Impairments: Reduced labial seal;Poor awareness of bolus    Nectar Thick Nectar Thick Liquid: Not tested   Honey Thick Honey Thick Liquid: Not tested   Puree Puree: Impaired Presentation: Spoon Oral Phase Impairments: Poor awareness of bolus   Solid     Solid: Not tested      Ellery Plunk 04/10/2023,9:32 AM

## 2023-04-10 NOTE — Progress Notes (Signed)
Daily Progress Note Intern Pager: [phone]  Patient name: Abigail Thomas Medical record number: [id number] Date of birth: [birth year] Age: 77 y.o. Gender: female  Primary Care Provider: Patient, No Pcp Per Consultants: neurology Code Status: DNR-limited  Pt Overview and Major Events to Date:  11/8: admitted to FMTS  Assessment and Plan: Abigail Thomas is a 76 year old with cerebral palsy and severe dementia presenting with a seizure-like episode witnessed by her son. Unclear etiology, appears to have returned to baseline. Deferring further workup given poor functional status at baseline and son's wishes to take pt home without invasive testing/imaging.  Assessment & Plan Witnessed seizure-like activity (HCC) Possible seizure at home. May have had underlying UTI that triggered this event. Given severe dementia and family wishes, deferring invasive workup with MRI as this would likely require pharmacologic sedation. Pending EEG results Pending neuro recommendations and further discussion with family to determine need for long term Keppra Severe dementia (HCC) Dependent on son for all ADLs and IADLs. Appears to have returned to baseline. Delirium precautions Fall precautions Consider palliative discussions    FEN/GI: full liquids, thin liquids with crushed meds PPx: lovenox  Dispo: home pending further discussion with son  Subjective:  Pt resting in bed, somewhat responsive to voice and touch.  Called son, [name] to discuss events leading to hospitalization. He states that he thought he heard the pt calling his name and he found her making abnormal noises (described as straining) with her eyes rolling back in her head and bobbing/shaking of her head. He states she was not having abnormal arms or leg movements. States she was very sleepy afterwards but had returned to normal once they were in the ED. Was told that she had a UTI in the ED, does not believe pt was in pain at  home.  [name] states the patient was eating and drinking normally prior to this episode.  Discussed likelihood that if pt were to have an MRI, she would probably need to be sedated. [name] stated he would not want this. [name] states he would like to take the patient home.   Objective: Temp:  [97 F (36.1 C)-100 F (37.8 C)] 100 F (37.8 C) (11/08 2315) Pulse Rate:  [76-93] 76 (11/08 2315) Resp:  [18-20] 20 (11/08 2201) BP: (132-162)/(80-103) 162/80 (11/08 2315) SpO2:  [92 %-100 %] 95 % (11/08 2315) Weight:  [64.5 kg-75.6 kg] 64.5 kg (11/08 2201) Physical Exam: General: laying in bed, asleep with some movements in response to name and touch, in NAD Cardiovascular: RRR, normal S1/S2 Respiratory: CTAB, normal WOB on RA Abdomen: normoactive bowel sounds, soft, nontender, nondistended Extremities: no edema to BLE Neuro: somnolent, does not open eyes to command, moving all extremities spontaneously  Laboratory: Most recent CBC Lab Results  Component Value Date   WBC 7.2 04/10/2023   HGB 13.1 04/10/2023   HCT 37.6 04/10/2023   MCV 94.2 04/10/2023   PLT 192 04/10/2023   Most recent BMP    Latest Ref Rng & Units 04/10/2023    4:34 AM  BMP  Glucose 70 - 99 mg/dL 474   BUN 8 - 23 mg/dL 28   Creatinine 2.59 - 1.00 mg/dL 5.63   Sodium 875 - 643 mmol/L 139   Potassium 3.5 - 5.1 mmol/L 4.1   Chloride 98 - 111 mmol/L 106   CO2 22 - 32 mmol/L 23   Calcium 8.9 - 10.3 mg/dL 9.3      Pattrick Bady, Tacey Ruiz, MD 04/10/2023, 7:03  AM  PGY-1, [facility] Family Medicine FPTS Intern pager: [phone], text pages welcome Secure chat group [facility] Teaching Service

## 2023-04-10 NOTE — Progress Notes (Signed)
NEUROLOGY CONSULT FOLLOW UP NOTE   Date of service: April 10, 2023 Patient Name: Abigail Thomas MRN:  [id number] DOB:  05-08-1946  Brief HPI  Abigail Thomas is a 77 y.o. female  with hx of Cerebral palsy, dementia p/w first time seizure like episode. No prior hx of seizures. Patient was loaded with Keppra 1500 mg IV once and admitted for further evaluation.   Episode witnessed by son where the patient heard a vocalization by patient that sounded like hard and fast respirations blowing with her lips, almost as if she was panting from her bedroom.  This blowing of her lips/fast blowing movements were accompanied by the patient's eyes being rolled into the back of her head and she was unresponsive by the time her son made it to her room.  Once her son was at the bedside, the event stopped within one minute. He did not notice any extremity stiffness or jerking movements associated with her abnormal breathing event.  The patient typically wears a brief throughout the night and is changed by her son in the morning as she is incontinent at baseline.  She did not reportedly bite her tongue as she is edentulous and was not wearing dentures during the event.   Patient at baseline, has dementia and oriented to self only and occasionally can idenfiy her son who lives with her.  She is wheelchair and bed bound for many years since 2017 and her son cares for all of her ALDs.  The patient reportedly sleeps quite a bit throughout the day and repeatedly tells her son "I want to get out of here and go home" because she does not recognize that she is in her home of 30 years.   Interval Hx/subjective  Routine EEG obtained this morning with concern for possible ictal-interictal activity, patient placed on overnight monitoring. No clinical signs of seizure activity noted on exam this morning for neurology APP.  Low grade temperature to 100 degrees Fahrenheit overnight.   Vitals   Vitals:   04/09/23 2105 04/09/23  2201 04/09/23 2315 04/10/23 0916  BP: 132/86  (!) 162/80 108/62  Pulse: 93  76 60  Resp:  20    Temp: 99.5 F (37.5 C) 99 F (37.2 C) 100 F (37.8 C)   TempSrc: Axillary Axillary Oral   SpO2: 93% 92% 95%   Weight:  64.5 kg    Height:  5\' 7"  (1.702 m)      Body mass index is 22.27 kg/m.  Physical Exam   Constitutional: Frail, elderly, Caucasian female laying in hospital bed.  Psych: Patient is significantly lethargic on exam this morning and does not participate in exam  Eyes: No scleral injection.  HENT: No OP obstrucion. Edentulous.  Head: Normocephalic and atraumatic.  Cardiovascular: Normal rate and regular rhythm on telemetry.  Respiratory: Effort normal, non-labored breathing on room air  GI: Soft.  No distension. There is no tenderness.  Skin: WDI.   Neurologic Examination   Mental Status: Patient is significantly lethargic.  She does not wake to loud voice, tactile stimulation, does not participate in examination.  She intermittently verbalizes through exam unintelligible or inappropriate responses (i.e. when asked to lift her leg she unintelligibly mutters with clearing in the sentence to say "back to sleep"). She does not answer orientation questions.  She does not follow commands.  She does not repeat phrases or naming objects.  She does not open her eyes throughout the exam.  Patient will yell briefly with application  of noxious stimuli throughout before falling quickly back to sleep.  Cranial Nerves: II: PERRL, blinks to threat with passive eye opening  III,IV, VI: Eyes with roving movements throughout with passive eye opening.  V: Patient does not participate in facial sensory testing.  VII: Facial movement is symmetric resting and with movement VIII: Patient does not open eyes or respond to loud voice on bedside exam this morning  X: Phonation intact with yelling, does not allow for visualization of soft palate on exam  XI: Head is grossly midline XII: Does not  protrude tongue to command Motor: Withdraws each extremity to noxious stimuli. Does not follow commands, does not attempt antigravity movement. When leaving the room, examiner did notice slight bilateral upper extremity movements to adjust her blanket but she did not participate in confrontational strength testing for examiner.  No further choreiform movements noted during APP examination this morning (noted to be present at baseline).  Tone: slightly increased in all extremities. Muscle bulk: poor Sensory: Grimaces and yells with application of noxious stimuli equally throughout. Cerebellar: Does not perform  Labs and Diagnostic Imaging   CBC:  Recent Labs  Lab 04/09/23 1310 04/10/23 0434  WBC 3.8* 7.2  NEUTROABS 2.1  --   HGB 14.0 13.1  HCT 41.5 37.6  MCV 96.7 94.2  PLT 202 192   Basic Metabolic Panel:  Lab Results  Component Value Date   NA 139 04/10/2023   K 4.1 04/10/2023   CO2 23 04/10/2023   GLUCOSE 102 (H) 04/10/2023   BUN 28 (H) 04/10/2023   CREATININE 0.84 04/10/2023   CALCIUM 9.3 04/10/2023   GFRNONAA >60 04/10/2023   GFRAA >60 03/16/2015   Lipid Panel: No results found for: "LDLCALC" HgbA1c:  Lab Results  Component Value Date   HGBA1C 4.6 (L) 04/10/2023   Urine Drug Screen:     Component Value Date/Time   LABOPIA NONE DETECTED 03/15/2015 2036   COCAINSCRNUR NONE DETECTED 03/15/2015 2036   LABBENZ NONE DETECTED 03/15/2015 2036   AMPHETMU NONE DETECTED 03/15/2015 2036   THCU NONE DETECTED 03/15/2015 2036   LABBARB NONE DETECTED 03/15/2015 2036    Alcohol Level     Component Value Date/Time   ETH <10 04/09/2023 1310   INR  Lab Results  Component Value Date   INR 1.07 03/15/2015   APTT  Lab Results  Component Value Date   APTT 33 03/15/2015   AED levels: No results found for: "PHENYTOIN", "ZONISAMIDE", "LAMOTRIGINE", "LEVETIRACETA"  CT Head without contrast(Personally reviewed): No acute intracranial abnormality.  Progressive atrophy and  chronic small vessel ischemia from 2017.  Technically limited exam due to positioning and motion.  rEEG:  "This EEG is most consistent with a generalized nonspecific cerebral dysfunction (encephalopathy), however in the correct clinical context, it could be possible that this represents a pattern on the ictal-interictal continuum and consideration to benzodiazepine challenge or longer monitoring to look for sleep/wake changes could be considered.    There was no definite seizure or seizure predisposition recorded on this study."  Impression   Abigail Thomas is a 77 y.o. female with severe dementia, cerebral palsy, with baseline irregular choreiform movements in BL upper extremities who is currently admitted with a seizure-like spell. The noted description per son via telephone this afternoon with blowing, hard, fast, panting-type breathing without any movements/stiffness in the extremities is somewhat atypical for seizures. A routine EEG was obtained with evidence of encephalopathy but there were also findings concerning for possible ictal-interictal pattern. Due to  patient's mentation and level of arousal on this morning's examination, I am hesitant to initiate a benzodiazepine challenge at this time to evaluate for response in continuous EEG monitoring.   Recommendations  - MRI brain with and without contrast when able to obtain due to concern for new onset seizure-like activity - Overnight EEG monitoring - Seizure precautions - Consider benzodiazepine challenge with improvement in level of arousal or when safe to do so - IV Versed for clinical seizure activity  - Appreciate identification and management of infectious and metabolic derangements per primary team ______________________________________________________________________  Thank you for the opportunity to take part in the care of this patient. If you have any further questions, please contact the neurology consultation team on call.  Updated oncall schedule is listed on AMION.  Signed,  Lanae Boast, AGACNP-BC [facility] Pager: [phone]   Attending Neurohospitalist Addendum Patient seen and examined with APP/Resident. Agree with the history and physical as documented above. Agree with the plan as documented, which I helped formulate. I have edited the note above to reflect my full findings and recommendations. I have independently reviewed the chart, obtained history, review of systems and examined the patient.I have personally reviewed pertinent head/neck/spine imaging (CT/MRI). Please feel free to call with any questions.  -- Bing Neighbors, MD [facility] [phone]  If 7pm- 7am, please page neurology on call as listed in AMION.

## 2023-04-10 NOTE — Progress Notes (Signed)
PT asleep as per RN. EEG to be attempted as schedule allows.

## 2023-04-10 NOTE — Progress Notes (Signed)
EEG complete - results pending 

## 2023-04-10 NOTE — Discharge Instructions (Addendum)
Dear Abigail Thomas,   Thank you for letting us participate in your care! You were admitted to the hospital after a possible seizure. We treated you with antibiotics for a urinary tract infection and monitored your heart and brain function. Please take your medications as prescribed.   POST-HOSPITAL & CARE INSTRUCTIONS We recommend following up with your PCP within 1 week from being discharged from the hospital. Please let PCP/Specialists know of any changes in medications that were made which you will be able to see in the medications section of this packet. Please make sure to discuss future medical care with the palliative care team as well as see your PCP.  DOCTOR'S APPOINTMENTS & FOLLOW UP Please call to make an event with your primary care provider as soon as possible.   Thank you for choosing [facility]! Take care and be well!  Family Medicine Teaching Service Inpatient Team [place]  [facility]  [address] [place], [place] [address] [phone]

## 2023-04-10 NOTE — Assessment & Plan Note (Addendum)
Dependent on son for all ADLs and IADLs. Appears to have returned to baseline. Delirium precautions Fall precautions Consider palliative discussions

## 2023-04-11 ENCOUNTER — Inpatient Hospital Stay (HOSPITAL_COMMUNITY): Payer: 59

## 2023-04-11 DIAGNOSIS — Z7189 Other specified counseling: Secondary | ICD-10-CM

## 2023-04-11 DIAGNOSIS — N390 Urinary tract infection, site not specified: Secondary | ICD-10-CM | POA: Insufficient documentation

## 2023-04-11 DIAGNOSIS — R569 Unspecified convulsions: Secondary | ICD-10-CM | POA: Diagnosis not present

## 2023-04-11 DIAGNOSIS — Z515 Encounter for palliative care: Secondary | ICD-10-CM

## 2023-04-11 NOTE — Discharge Summary (Signed)
Family Medicine Teaching [facility] Discharge Summary  Patient name: Abigail Thomas Medical record number: [id number] Date of birth: May 31, 1946 Age: 77 y.o. Gender: female Date of Admission: 04/09/2023  Date of Discharge: 04/11/23 Admitting Physician: Meryl Dare, MD  Primary Care Provider: Patient, No Pcp Per Consultants: Neurology  Indication for Hospitalization: Concern for seizure  Discharge Diagnoses/Problem List:  Principal Problem for Admission: Severe end-stage dementia Other Problems addressed during stay:  Principal Problem:   Witnessed seizure-like activity (HCC) Active Problems:   Severe dementia (HCC)   Pressure injury of skin   UTI (urinary tract infection)    Brief Hospital Course:  Abigail Thomas is a 77 y.o.female with a history of cerebral palsy, severe dementia who was admitted to the [facility] Medicine Teaching Service at [facility] for seizure-like event. Her hospital course is detailed below:  Seizure-like activity Per son, pt had abnormal vocalizations and head movements followed by period of excess sleepiness in the setting of advanced dementia.  Neurology was consulted and patient was loaded with Keppra. Short term EEG showed nonspecific cerebral dysfunction, possible ictal-interictal pattern. MRI brain was recommended by Neurology. Per goals of care discussions with patient son, this was preferred to be pursued outpatient. Overnight EEG showed continued encephalopathic changes.  Overall neurology suspected that it is unlikely patient had seizure and symptoms are mostly due to delirium in the setting of severe Alzheimer's dementia.  Recommended outpatient neurology follow-up and potential outpatient MRI brain.  Patient was not discharged on antiepileptics.  Severe Alzheimer's dementia Multiple goals of care discussion with patient's primary decision maker and caretaker son, Abigail Thomas.  Palliative care consulted, son not ready to make transition to  hospice care at this time. Patient discharged with set up for outpatient palliative care.  Concern for UTI Patient unable to verbalize urinary symptoms.  However, UA consistent with possible urinary tract infection.  Received 1 dose ceftriaxone in the ED.  Patient remained urinary incontinent throughout admission however unclear if this is due to dementia or urinary symptoms.  Urine culture positive for E. coli.  Suspected colonization, patient was not discharged with further antibiotics.  Other chronic conditions were medically managed with home medications and formulary alternatives as necessary (none)  PCP Follow-up Recommendations: Outpatient neurology follow-up. Consider outpatient MRI of the brain. Palliative care outpatient.   Disposition: Home with home health  Discharge Condition: Stable Discharge Exam:  Vitals:   04/11/23 0700 04/11/23 1136  BP: (!) 124/90 122/74  Pulse: 62 60  Resp: 18 20  Temp: 98.7 F (37.1 C) 98.6 F (37 C)  SpO2: 98% 97%   General: NAD, lying comfortably in hospital bed, fidgeting and pulling at blankets, appears calmer than prior visit Neuro: A&O, not oriented to person place or time, speaks to son but would not speak to provider Cardiovascular: RRR, no murmurs, no peripheral edema Respiratory: normal WOB on RA, CTAB, no wheezes, ronchi or rales Abdomen: Nondistended Extremities: Moving all 4 extremities equally  Significant Procedures: None  Significant Labs and Imaging:  Recent Labs  Lab 04/10/23 0434  WBC 7.2  HGB 13.1  HCT 37.6  PLT 192   Recent Labs  Lab 04/10/23 0434  NA 139  K 4.1  CL 106  CO2 23  GLUCOSE 102*  BUN 28*  CREATININE 0.84  CALCIUM 9.3   CT Head 04/09/23 1. No acute intracranial abnormality. 2. Progressive atrophy and chronic small vessel ischemia from 2017. 3. Technically limited exam due to positioning and motion.  Results/Tests Pending  at Time of Discharge: None  Discharge Medications:  Allergies  as of 04/11/2023   No Known Allergies      Medication List     TAKE these medications    aspirin-acetaminophen-caffeine 250-250-65 MG tablet Commonly known as: EXCEDRIN MIGRAINE Take 1 tablet by mouth every 6 (six) hours as needed for headache.   cholecalciferol 25 MCG (1000 UNIT) tablet Commonly known as: VITAMIN D3 Take 1,000 Units by mouth daily.   COLACE PO Take 1 tablet by mouth daily.        Discharge Instructions: Please refer to Patient Instructions section of EMR for full details.  Patient was counseled important signs and symptoms that should prompt return to medical care, changes in medications, dietary instructions, activity restrictions, and follow up appointments.   Follow-Up Appointments:  Follow-up Information     [facility] Follow [facility]   Specialty: Home Health Services Why: they will call you to set up a time to come visit you at home for therapies and aid Contact information: [address] [place] [address] [phone]         [facility] Follow up.   Specialty: PALLIATIVE CARE Why: Palliative Care provider. They will call you to do intake assessment Contact information: [address] [facility] [place] [address] [phone]                Celine Mans, MD 04/11/2023, 1:35 PM PGY-2, [facility] Family Medicine

## 2023-04-11 NOTE — Plan of Care (Signed)

## 2023-04-11 NOTE — Assessment & Plan Note (Addendum)
Concern for possible UTI contributing to altered mental status based on UA results.  Given patient's dementia it is unclear whether she is having urinary symptoms. S/p 1 dose of ceftriaxone.  Urine culture positive for GNR. Will not treat further unless clinically indicated

## 2023-04-11 NOTE — Progress Notes (Signed)
NEUROLOGY CONSULT FOLLOW UP NOTE   Date of service: April 11, 2023 Patient Name: Abigail Thomas MRN:  [id number] DOB:  06-Sep-1945  Brief HPI  Abigail Thomas is a 77 y.o. female  with hx of Cerebral palsy, dementia p/w first time seizure like episode. No prior hx of seizures. Patient was loaded with Keppra 1500 mg IV once and admitted for further evaluation.   Episode witnessed by son where the patient heard a vocalization by patient that sounded like hard and fast respirations blowing with her lips, almost as if she was panting from her bedroom.  This blowing of her lips/fast blowing movements were accompanied by the patient's eyes being rolled into the back of her head and she was unresponsive by the time her son made it to her room.  Once her son was at the bedside, the event stopped within one minute. He did not notice any extremity stiffness or jerking movements associated with her abnormal breathing event.  The patient typically wears a brief throughout the night and is changed by her son in the morning as she is incontinent at baseline.  She did not reportedly bite her tongue as she is edentulous and was not wearing dentures during the event.   Patient at baseline, has dementia and oriented to self only and occasionally can idenfiy her son who lives with her.  She is wheelchair and bed bound for many years since 2017 and her son cares for all of her ALDs.  The patient reportedly sleeps quite a bit throughout the day and repeatedly tells her son "I want to get out of here and go home" because she does not recognize that she is in her home of 30 years.   Interval Hx/subjective  Patient has shown no signs of seizure activity, but EEG continues to demonstrate triphasic GPD's.  She appears to be close to or back to her baseline, awake but not interactive, talking but not responsive to commands.  Vitals   Vitals:   04/10/23 1929 04/10/23 2345 04/11/23 0321 04/11/23 0700  BP: (!) 111/95 (!)  108/53 (!) 110/49 (!) 124/90  Pulse: 70 (!) 56 (!) 59 62  Resp: 18 18 18 18   Temp: 98.1 F (36.7 C) (!) 97.5 F (36.4 C) 98.9 F (37.2 C) 98.7 F (37.1 C)  TempSrc: Oral Oral Oral Oral  SpO2: 98% 94% 98% 98%  Weight:      Height:        Body mass index is 22.27 kg/m.  Physical Exam   Constitutional: Frail, elderly, Caucasian female laying in hospital bed.  Psych: Patient is significantly lethargic on exam this morning and does not participate in exam  Eyes: No scleral injection.  HENT: No OP obstrucion. Edentulous.  Head: Normocephalic and atraumatic.  Cardiovascular: Normal rate and regular rhythm on telemetry.  Respiratory: Effort normal, non-labored breathing on room air  GI: Soft.  No distension. There is no tenderness.  Skin: WDI.   Neurologic Examination   Mental Status: Patient rests with eyes open and talks, repeatedly stating that something is "turning red" she does not or answer orientation questions or respond to commands. Cranial Nerves: II: PERRL, blinks to threat with passive eye opening  III,IV, VI: Extraocular movements intact but does not track examiner V: Patient does not participate in facial sensory testing.  VII: Facial movement is symmetric resting and with movement VIII: Patient does not open eyes or respond to loud voice on bedside exam this morning  X: Phonation  intact  XI: Head is grossly midline XII: Does not protrude tongue to command Motor: Able to move all 4 extremities with some antigravity movement of bilateral upper extremities Tone: slightly increased in all extremities. Muscle bulk: poor Sensory: Appears intact to noxious stimuli Cerebellar: Does not perform  Labs and Diagnostic Imaging   CBC:  Recent Labs  Lab 04/09/23 1310 04/10/23 0434  WBC 3.8* 7.2  NEUTROABS 2.1  --   HGB 14.0 13.1  HCT 41.5 37.6  MCV 96.7 94.2  PLT 202 192   Basic Metabolic Panel:  Lab Results  Component Value Date   NA 139 04/10/2023   K 4.1  04/10/2023   CO2 23 04/10/2023   GLUCOSE 102 (H) 04/10/2023   BUN 28 (H) 04/10/2023   CREATININE 0.84 04/10/2023   CALCIUM 9.3 04/10/2023   GFRNONAA >60 04/10/2023   GFRAA >60 03/16/2015   Lipid Panel: No results found for: "LDLCALC" HgbA1c:  Lab Results  Component Value Date   HGBA1C 4.6 (L) 04/10/2023   Urine Drug Screen:     Component Value Date/Time   LABOPIA NONE DETECTED 03/15/2015 2036   COCAINSCRNUR NONE DETECTED 03/15/2015 2036   LABBENZ NONE DETECTED 03/15/2015 2036   AMPHETMU NONE DETECTED 03/15/2015 2036   THCU NONE DETECTED 03/15/2015 2036   LABBARB NONE DETECTED 03/15/2015 2036    Alcohol Level     Component Value Date/Time   ETH <10 04/09/2023 1310   INR  Lab Results  Component Value Date   INR 1.07 03/15/2015   APTT  Lab Results  Component Value Date   APTT 33 03/15/2015   AED levels: No results found for: "PHENYTOIN", "ZONISAMIDE", "LAMOTRIGINE", "LEVETIRACETA"  CT Head without contrast(Personally reviewed): No acute intracranial abnormality.  Progressive atrophy and chronic small vessel ischemia from 2017.  Technically limited exam due to positioning and motion.  rEEG:  "This EEG is most consistent with a generalized nonspecific cerebral dysfunction (encephalopathy), however in the correct clinical context, it could be possible that this represents a pattern on the ictal-interictal continuum and consideration to benzodiazepine challenge or longer monitoring to look for sleep/wake changes could be considered.    There was no definite seizure or seizure predisposition recorded on this study."  EEG 11/10: Periodic discharges with triphasic morphology, generalized, continuous slow generalized, no seizures or epileptiform discharges  Impression   Abigail Thomas is a 77 y.o. female with severe dementia, cerebral palsy, with baseline irregular choreiform movements in BL upper extremities who is currently admitted with a seizure-like spell. The noted  description per son via telephone this afternoon with blowing, hard, fast, panting-type breathing without any movements/stiffness in the extremities is somewhat atypical for seizures. A routine EEG was obtained with evidence of encephalopathy but there were also findings concerning for possible ictal-interictal pattern. Patient has demonstrated no seizure-like activity overnight and appears to be closer to her mental baseline.  Suspect that patient's initial episode was a manifestation of her advanced dementia rather than seizure activity.  Will discontinue long-term EEG.  Recommendations   -Discontinue long-term EEG given lack of seizure activity - No indication for AEDs at this time - Neurology to be available prn for questions going forward ______________________________________________________________________  Thank you for the opportunity to take part in the care of this patient. If you have any further questions, please contact the neurology consultation team on call. Updated oncall schedule is listed on AMION.  Patient seen by NP and then by MD, MD to edit note as needed.  Signed,  Cortney E Ernestina Columbia , MSN, AGACNP-BC [facility] See Amion for schedule and pager information 04/11/2023 9:52 AM

## 2023-04-11 NOTE — Plan of Care (Signed)
  Problem: Education: Goal: Knowledge of General Education information will improve Description: Including pain rating scale, medication(s)/side effects and non-pharmacologic comfort measures Outcome: Adequate for Discharge   Problem: Health Behavior/Discharge Planning: Goal: Ability to manage health-related needs will improve Outcome: Adequate for Discharge   Problem: Clinical Measurements: Goal: Ability to maintain clinical measurements within normal limits will improve Outcome: Adequate for Discharge Goal: Will remain free from infection Outcome: Adequate for Discharge Goal: Diagnostic test results will improve Outcome: Adequate for Discharge Goal: Respiratory complications will improve Outcome: Adequate for Discharge Goal: Cardiovascular complication will be avoided Outcome: Adequate for Discharge   Problem: Activity: Goal: Risk for activity intolerance will decrease 04/11/2023 1756 by Thom Chimes, RN Outcome: Adequate for Discharge 04/11/2023 1400 by Thom Chimes, RN Outcome: Progressing   Problem: Nutrition: Goal: Adequate nutrition will be maintained 04/11/2023 1756 by Thom Chimes, RN Outcome: Adequate for Discharge 04/11/2023 1400 by Thom Chimes, RN Outcome: Progressing   Problem: Coping: Goal: Level of anxiety will decrease 04/11/2023 1756 by Thom Chimes, RN Outcome: Adequate for Discharge 04/11/2023 1400 by Thom Chimes, RN Outcome: Progressing   Problem: Elimination: Goal: Will not experience complications related to bowel motility Outcome: Adequate for Discharge Goal: Will not experience complications related to urinary retention Outcome: Adequate for Discharge   Problem: Pain Management: Goal: General experience of comfort will improve Outcome: Adequate for Discharge   Problem: Safety: Goal: Ability to remain free from injury will improve Outcome: Adequate for Discharge   Problem: Skin Integrity: Goal: Risk for impaired  skin integrity will decrease Outcome: Adequate for Discharge

## 2023-04-11 NOTE — Assessment & Plan Note (Signed)
EEG demonstrating encephalopathy, but no definitive seizure.  As discussed previously patient's baseline mental status is very unclear, suspect this is the presentation of severe end-stage dementia towards end-of-life. Continue to follow EEG Neurology consulted, recs appreciated Would recommend getting MRI if able Patient family adverse to further workup Likely DC today pending final neurology recommendations

## 2023-04-11 NOTE — Progress Notes (Signed)
[facility] here to transport home via stretcher.

## 2023-04-11 NOTE — TOC Transition Note (Signed)
Transition of Care [facility]) - CM/SW Discharge Note   Patient Details  Name: Abigail Thomas MRN: [id number] Date of Birth: 1945-09-17  [facility] CM/SW Contact:  Lawerance Sabal, RN Phone Number: 04/11/2023, 1:11 PM   Clinical Narrative:     Sherron Monday w patient's son Leotis Shames at the bedside. Patient and [name] live together at address on file. Patient will DC to home today in need of palliative care and home health services. Leotis Shames does not have preference for providers. Referral made to [facility], and [facility].  Patient has needed DME at home, hoyer and hospital bed.  Patient will need [facility] transport, forms placed on chart.  PCP Marletta Lor   Final next level of care: Home w Home Health Services Barriers to Discharge: No Barriers Identified   Patient Goals and CMS Choice CMS Medicare.gov Compare Post Acute Care list provided to:: Other (Comment Required) Choice offered to / list presented to : Adult Children  Discharge Placement                         Discharge Plan and Services Additional resources added to the After Visit Summary for                  DME Arranged: N/A         HH Arranged: RN, OT, Nurse's Aide HH Agency: [facility] Date [facility] Agency Contacted: 04/11/23 Time HH Agency Contacted: 1310 Representative spoke with at [facility] Agency: Kandee Keen  Social Determinants of Health (SDOH) Interventions SDOH Screenings   Food Insecurity: Patient Unable To Answer (04/09/2023)  Housing: High Risk (04/09/2023)  Transportation Needs: Patient Unable To Answer (04/09/2023)  Utilities: Patient Unable To Answer (04/09/2023)  Tobacco Use: Unknown (04/10/2023)     Readmission Risk Interventions     No data to display

## 2023-04-11 NOTE — Procedures (Addendum)
Patient Name: Abigail Thomas  MRN: [id number]  Epilepsy Attending: Charlsie Quest  Referring Physician/Provider: Rejeana Brock, MD  Duration: 04/10/2023 1058 to 04/11/2023 1001  Patient history:  77 year old female being evaluated for intermittent jerking. EEG to evaluate for seizure  Level of alertness: Awake, asleep  AEDs during EEG study: None  Technical aspects: This EEG study was done with scalp electrodes positioned according to the 10-20 International system of electrode placement. Electrical activity was reviewed with band pass filter of 1-70Hz , sensitivity of 7 uV/mm, display speed of 64mm/sec with a 60Hz  notched filter applied as appropriate. EEG data were recorded continuously and digitally stored.  Video monitoring was available and reviewed as appropriate.  Description: The posterior dominant rhythm consists of 5-6 Hz activity of moderate voltage (25-35 uV) seen predominantly in posterior head regions, symmetric and reactive to eye opening and eye closing. Sleep was characterized by vertex waves, sleep spindles (12 to 14 Hz), maximal frontocentral region. EEG showed continuous generalized 3 to 6 Hz theta-delta slowing admixed with 12-14Hz  beta activity with irregular morphology distributed symmetrically and diffusely. At the beginning of the study, EEG showed generalized periodic discharges ( GPDs) with triphasic morphology at 1-1.5  Hz,. Gradually after around 2230 on 04/10/2023, patient fell asleep and GPDs abated.  At the beginning of the study, patient was noted to have tapping movement with his right hand when she was awake, Concomitant EEG during this did not show any EEG changes suggest seizure.  Hyperventilation and photic stimulation were not performed.     ABNORMALITY - Periodic discharges with triphasic morphology, generalized ( GPDs) - Continuous slow, generalized  IMPRESSION: This study showed  generalized periodic discharges ( GPDs) with triphasic  morphology which can be on the ictal-interictal continuum. However the morphology, frequency and reactivity to stimulation is most likely indicative of toxic-metabolic causes. Additionally there was moderate diffuse encephalopathy likely related to toxic-metabolic etiology. No seizures or definite epileptiform discharges were seen throughout the recording.   [name] [name]

## 2023-04-11 NOTE — Plan of Care (Signed)
  Problem: Activity: Goal: Risk for activity intolerance will decrease Outcome: Progressing   Problem: Nutrition: Goal: Adequate nutrition will be maintained Outcome: Progressing   Problem: Coping: Goal: Level of anxiety will decrease Outcome: Progressing   

## 2023-04-11 NOTE — Assessment & Plan Note (Signed)
Dependent on son for all ADLs and IADLs. Appears to have returned to baseline. Delirium precautions Fall precautions Consider palliative discussions SLP following

## 2023-04-11 NOTE — Consult Note (Signed)
Palliative Medicine Inpatient Consult Note  Consulting Provider: Celine Mans, MD   Reason for consult:   Palliative Care Consult Services Palliative Medicine Consult  Reason for Consult? Outpatient palliatve care follow-up.   04/11/2023  HPI:  Per intake H&P --> Abigail Thomas is a 77 year old with cerebral palsy and severe dementia presenting with a seizure-like episode witnessed by her son. Unclear etiology, appears to have returned to baseline. Deferring further workup given poor functional status at baseline and son's wishes to take pt home without invasive testing/imaging.   Palliative care has been asked to get involved to support additional conversations on palliative services.   Clinical Assessment/Goals of Care:  *Please note that this is a verbal dictation therefore any spelling or grammatical errors are due to the "Dragon Medical One" system interpretation.  I have reviewed medical records including EPIC notes, labs and imaging, received report from bedside RN, assessed the patient who is lying in bed. Patients RN, Lamar Laundry present at bedside.     I called patients son, Abigail Thomas to further discuss diagnosis prognosis, GOC, EOL wishes, disposition and options.   I introduced Palliative Medicine as specialized medical care for people living with serious illness. It focuses on providing relief from the symptoms and stress of a serious illness. The goal is to improve quality of life for both the patient and the family.  Medical History Review and Understanding:  A review of patient's past medical history significant for cerebral palsy, hypertension, and dementia was held.  Social History:  Reviewed that Abigail Thomas is from [facility].  She was married though got divorced in 1993.  She has 1 son and 2 daughters.  She has 1 grandchild.  She has been on disability throughout the duration of her life in the setting of her cerebral palsy.  She is identified as a woman of  strong faith and practices within Christianity.  Functional and Nutritional State:  Preceding hospitalization Abigail Thomas has lived in a [place] with her son, Abigail Thomas since 2017.  Abigail Thomas performs all B ADLs and IADLs with the exception of patient being able to self-feed from time to time.  Patient's appetite has been fair.  Advance Directives:  A detailed discussion was had today regarding advanced directives.  Patient's son shares that he is her primary caregiver and decision maker.  There are no advanced directives.  Informed all children need to be involved in decisions unless both sisters agree to allow Abigail Thomas to make all [facility] related decisions.   Code Status:  Concepts specific to code status, artifical feeding and hydration, continued IV antibiotics and rehospitalization was had.  The difference between a aggressive medical intervention path  and a palliative comfort care path for this patient at this time was had.   Abigail Thomas is an established DO NOT RESUSCITATE DO NOT INTUBATE CODE STATUS.  Discussion:  We reviewed Jesse's acute hospitalization in the setting of seizures which per her son she has never had.  He feels that it was likely urinary tract infection which she would has which caused the patient to have seizures.  He expresses despite her cerebral palsy and going in and out of the hospital countless times throughout her life she has maintained a degree of health.  Abigail Thomas expresses that over the last few years her dementia symptoms have worsened to the point whereby he is totally responsible for her.  He shares the most distressing symptom is her inability to move or help with her bathing.  He asked  if we will be able to help in terms of having someone intubated with him.    _____  I took this as an opportunity to explain the differences between home health, palliative care, and hospice care as below:  Home Health provides a range of medical services   to patients in their homes to  treat or manage illnesses, injuries, or other medical conditions.  Inclusive of personal care, household chores, meal preparation, & clinical tasks. Home health care workers can include home health aides, personal care aides, companions, nursing assistants, or home health nurses.   Palliative care is specialized medical care for people living with a serious illness, such as cancer, heart failure, COPD, Alzheimer's dementia, etc. Patients in palliative care may receive medical care for their symptoms, or palliative care, along with treatment intended to cure their serious illness   Hospice care focuses on the care, comfort, and quality of life of a person with a serious illness who is approaching the end of life. At some point, it may not be possible to cure a serious illness, or a patient may choose not to undergo certain treatments. Hospice is designed for this situation. ______  We discussed that Sorelle is at a point where her illness is worsening though patient's son does not feel ready to move towards hospice care.  He wants to keep her "around as long as possible".  He does specify that he would not want her to be uncomfortable.  We discussed the importance of outpatient palliative support given the severity of Jesse's illnesses and her likely progression towards the need for hospice care.  Patient's son at this time shares he needs help with home bathing, he is hopeful to get support to enroll patient in Meals on Wheels, and Abigail Thomas is interested in outpatient palliative support to continue assessing [name] and her symptom needs in addition to readdressing goals of care.  Provided  "Hard Choices for Loving People" booklet, MOST form, and information sheet on Palliative care to be sent home with patient.  Decision Maker: Lorenson,Jeffrey (Son): [phone] (Mobile)   SUMMARY OF RECOMMENDATIONS   DNAR/DNI  Discussed patient's acute on chronic disease burden and likely progression of her  dementia  Explained the differences between home health, palliative care, and hospice care  Patient's son would like for patient to have a home health aide, enrollment in Meals on Wheels, and outpatient palliative support --> TOC had been made aware  Palliative medical team will remain peripherally involved and/or as needed  Code Status/Advance Care Planning: DNAR/DNI  Palliative Prophylaxis:  Aspiration, Bowel Regimen, Delirium Protocol, Frequent Pain Assessment, Oral Care, Palliative Wound Care, and Turn Reposition  Additional Recommendations (Limitations, Scope, Preferences): Continue current care  Psycho-social/Spiritual:  Desire for further Chaplaincy support: Not presently Additional Recommendations: Education on disease progression - dementia   Prognosis: Patient with declined health appears to have significant dementia. High 12 month mortality risk.   Discharge Planning: Discharge home with [facility].   Vitals:   04/11/23 0700 04/11/23 1136  BP: (!) 124/90 122/74  Pulse: 62 60  Resp: 18 20  Temp: 98.7 F (37.1 C) 98.6 F (37 C)  SpO2: 98% 97%   No intake or output data in the 24 hours ending 04/11/23 1243 Last Weight  Most recent update: 04/09/2023 11:07 PM    Weight  64.5 kg (142 lb 3.2 oz)            Gen:  Elderly Caucasian F chronically ill appearing HEENT: moist mucous  membranes CV: Regular rate and rhythm  PULM: On RA, breathing is even and nonlabored ABD: soft/nontender  EXT: No edema  Neuro: Opens eyes, not engaged  PPS: 30%   This conversation/these recommendations were discussed with patient primary care team, Dr. Vida Rigger  Billing based on MDM: High  Problems Addressed: One acute or chronic illness or injury that poses a threat to life or bodily function  Amount and/or Complexity of Data: Category 3:Discussion of management or test interpretation with external physician/other qualified health care professional/appropriate source (not separately  reported)  Risks: Decision regarding hospitalization or escalation of hospital care and Decision not to resuscitate or to de-escalate care because of poor prognosis ______________________________________________________ Lamarr Lulas [facility] Palliative Medicine Team Team Cell Phone: [phone] Please utilize secure chat with additional questions, if there is no response within 30 minutes please call the above phone number  Palliative Medicine Team providers are available by phone from 7am to 7pm daily and can be reached through the team cell phone.  Should this patient require assistance outside of these hours, please call the patient's attending physician.

## 2023-04-11 NOTE — Progress Notes (Signed)
     Daily Progress Note Intern Pager: [phone]  Patient name: Abigail Thomas Medical record number: [id number] Date of birth: July 10, 1945 Age: 77 y.o. Gender: female  Primary Care Provider: Patient, No Pcp Per Consultants: Neurology Code Status: DNR limited  Pt Overview and Major Events to Date:  11/8: admitted to FMTS  Assessment and Plan:  Abigail Thomas is a 77 year old with cerebral palsy and severe dementia presenting with a seizure-like episode witnessed by her son. Unclear etiology, appears to have returned to baseline. Deferring further workup given poor functional status at baseline and son's wishes to take pt home without invasive testing/imaging.  Assessment & Plan Witnessed seizure-like activity (HCC) EEG demonstrating encephalopathy, but no definitive seizure.  As discussed previously patient's baseline mental status is very unclear, suspect this is the presentation of severe end-stage dementia towards end-of-life. Continue to follow EEG Neurology consulted, recs appreciated Would recommend getting MRI if able Patient family adverse to further workup Likely DC today pending final neurology recommendations Severe dementia (HCC) Dependent on son for all ADLs and IADLs. Appears to have returned to baseline. Delirium precautions Fall precautions Consider palliative discussions SLP following UTI (urinary tract infection) Concern for possible UTI contributing to altered mental status based on UA results.  Given patient's dementia it is unclear whether she is having urinary symptoms. S/p 1 dose of ceftriaxone.  Urine culture positive for GNR. Will not treat further unless clinically indicated   FEN/GI: Thin liquids PPx: Lovenox Dispo: Pending final neurology recs, likely home today with son  Subjective:  No acute events overnight.  Patient's son at bedside.  States she is improved since admission.  No longer smacking gums and moving head.  Patient appears  calmer.  Objective: Temp:  [97.5 F (36.4 C)-98.9 F (37.2 C)] 98.9 F (37.2 C) (11/10 0321) Pulse Rate:  [56-70] 59 (11/10 0321) Resp:  [18] 18 (11/10 0321) BP: (108-142)/(49-98) 110/49 (11/10 0321) SpO2:  [94 %-98 %] 98 % (11/10 0321) Physical Exam: General: NAD, lying comfortably in hospital bed, fidgeting and pulling at blankets, appears calmer than prior visit Neuro: A&O, not oriented to person place or time, speaks to son but would not speak to provider Cardiovascular: RRR, no murmurs, no peripheral edema Respiratory: normal WOB on RA, CTAB, no wheezes, ronchi or rales Abdomen: Nondistended Extremities: Moving all 4 extremities equally   Laboratory: Most recent CBC Lab Results  Component Value Date   WBC 7.2 04/10/2023   HGB 13.1 04/10/2023   HCT 37.6 04/10/2023   MCV 94.2 04/10/2023   PLT 192 04/10/2023   Most recent BMP    Latest Ref Rng & Units 04/10/2023    4:34 AM  BMP  Glucose 70 - 99 mg/dL 562   BUN 8 - 23 mg/dL 28   Creatinine 1.30 - 1.00 mg/dL 8.65   Sodium 784 - 696 mmol/L 139   Potassium 3.5 - 5.1 mmol/L 4.1   Chloride 98 - 111 mmol/L 106   CO2 22 - 32 mmol/L 23   Calcium 8.9 - 10.3 mg/dL 9.3    Urine culture - Gram Negative rods >100,000  Imaging/Diagnostic Tests: No new imaging Celine Mans, MD 04/11/2023, 7:55 AM  PGY-2, [facility] FPTS Intern pager: [phone], text pages welcome Secure chat group [facility] Teaching Service

## 2023-04-11 NOTE — Progress Notes (Signed)
vLTM discontinued  No skin breakdown at all skin sites  trium notified

## 2023-04-12 LAB — URINE CULTURE: Culture: >=100000 — AB

## 2023-04-14 LAB — CULTURE, BLOOD (ROUTINE X 2)
Culture: NO GROWTH
Culture: NO GROWTH

## 2023-06-28 ENCOUNTER — Inpatient Hospital Stay (HOSPITAL_COMMUNITY)
Admission: EM | Admit: 2023-06-28 | Discharge: 2023-07-04 | DRG: 640 | Disposition: A | Discharge status: Hospice | Payer: 59 | Attending: Infectious Diseases | Admitting: Infectious Diseases

## 2023-06-28 ENCOUNTER — Encounter (HOSPITAL_COMMUNITY): Payer: Self-pay

## 2023-06-28 ENCOUNTER — Emergency Department (HOSPITAL_COMMUNITY): Payer: 59

## 2023-06-28 ENCOUNTER — Other Ambulatory Visit: Payer: Self-pay

## 2023-06-28 ENCOUNTER — Observation Stay (HOSPITAL_COMMUNITY): Payer: 59

## 2023-06-28 DIAGNOSIS — G309 Alzheimer's disease, unspecified: Secondary | ICD-10-CM | POA: Diagnosis present

## 2023-06-28 DIAGNOSIS — E872 Acidosis, unspecified: Secondary | ICD-10-CM | POA: Diagnosis present

## 2023-06-28 DIAGNOSIS — E871 Hypo-osmolality and hyponatremia: Secondary | ICD-10-CM | POA: Diagnosis present

## 2023-06-28 DIAGNOSIS — E870 Hyperosmolality and hypernatremia: Secondary | ICD-10-CM | POA: Diagnosis not present

## 2023-06-28 DIAGNOSIS — D751 Secondary polycythemia: Secondary | ICD-10-CM | POA: Diagnosis present

## 2023-06-28 DIAGNOSIS — R410 Disorientation, unspecified: Principal | ICD-10-CM

## 2023-06-28 DIAGNOSIS — Z23 Encounter for immunization: Secondary | ICD-10-CM

## 2023-06-28 DIAGNOSIS — F05 Delirium due to known physiological condition: Secondary | ICD-10-CM | POA: Diagnosis present

## 2023-06-28 DIAGNOSIS — G9389 Other specified disorders of brain: Secondary | ICD-10-CM | POA: Diagnosis present

## 2023-06-28 DIAGNOSIS — N390 Urinary tract infection, site not specified: Secondary | ICD-10-CM | POA: Diagnosis present

## 2023-06-28 DIAGNOSIS — D696 Thrombocytopenia, unspecified: Secondary | ICD-10-CM | POA: Diagnosis present

## 2023-06-28 DIAGNOSIS — K5900 Constipation, unspecified: Secondary | ICD-10-CM | POA: Diagnosis present

## 2023-06-28 DIAGNOSIS — F02C11 Dementia in other diseases classified elsewhere, severe, with agitation: Secondary | ICD-10-CM | POA: Diagnosis present

## 2023-06-28 DIAGNOSIS — F03C0 Unspecified dementia, severe, without behavioral disturbance, psychotic disturbance, mood disturbance, and anxiety: Secondary | ICD-10-CM | POA: Diagnosis present

## 2023-06-28 DIAGNOSIS — Z66 Do not resuscitate: Secondary | ICD-10-CM | POA: Diagnosis present

## 2023-06-28 DIAGNOSIS — G9341 Metabolic encephalopathy: Secondary | ICD-10-CM | POA: Diagnosis not present

## 2023-06-28 DIAGNOSIS — L8990 Pressure ulcer of unspecified site, unspecified stage: Secondary | ICD-10-CM | POA: Diagnosis present

## 2023-06-28 DIAGNOSIS — F02C0 Dementia in other diseases classified elsewhere, severe, without behavioral disturbance, psychotic disturbance, mood disturbance, and anxiety: Secondary | ICD-10-CM

## 2023-06-28 DIAGNOSIS — G809 Cerebral palsy, unspecified: Secondary | ICD-10-CM | POA: Diagnosis present

## 2023-06-28 DIAGNOSIS — E876 Hypokalemia: Secondary | ICD-10-CM | POA: Diagnosis present

## 2023-06-28 DIAGNOSIS — L89152 Pressure ulcer of sacral region, stage 2: Secondary | ICD-10-CM | POA: Diagnosis present

## 2023-06-28 DIAGNOSIS — Z515 Encounter for palliative care: Secondary | ICD-10-CM

## 2023-06-28 DIAGNOSIS — L89322 Pressure ulcer of left buttock, stage 2: Secondary | ICD-10-CM | POA: Diagnosis present

## 2023-06-28 DIAGNOSIS — E860 Dehydration: Secondary | ICD-10-CM | POA: Diagnosis present

## 2023-06-28 DIAGNOSIS — G9340 Encephalopathy, unspecified: Secondary | ICD-10-CM | POA: Diagnosis present

## 2023-06-28 DIAGNOSIS — R262 Difficulty in walking, not elsewhere classified: Secondary | ICD-10-CM | POA: Diagnosis present

## 2023-06-28 DIAGNOSIS — E8809 Other disorders of plasma-protein metabolism, not elsewhere classified: Secondary | ICD-10-CM | POA: Diagnosis present

## 2023-06-28 DIAGNOSIS — N19 Unspecified kidney failure: Secondary | ICD-10-CM | POA: Diagnosis present

## 2023-06-28 DIAGNOSIS — D7589 Other specified diseases of blood and blood-forming organs: Secondary | ICD-10-CM | POA: Diagnosis present

## 2023-06-28 DIAGNOSIS — B9620 Unspecified Escherichia coli [E. coli] as the cause of diseases classified elsewhere: Secondary | ICD-10-CM | POA: Diagnosis present

## 2023-06-28 DIAGNOSIS — R1310 Dysphagia, unspecified: Secondary | ICD-10-CM | POA: Diagnosis present

## 2023-06-28 LAB — CBC
HCT: 52.2 % — ABNORMAL HIGH (ref 36.0–46.0)
HCT: 44.7 % (ref 36.0–46.0)
Hemoglobin: 16.7 g/dL — ABNORMAL HIGH (ref 12.0–15.0)
Hemoglobin: 13.5 g/dL (ref 12.0–15.0)
MCH: 32.7 pg (ref 26.0–34.0)
MCH: 32.8 pg (ref 26.0–34.0)
MCHC: 32 g/dL (ref 30.0–36.0)
MCHC: 30.2 g/dL (ref 30.0–36.0)
MCV: 102.4 fL — ABNORMAL HIGH (ref 80.0–100.0)
MCV: 108.8 fL — ABNORMAL HIGH (ref 80.0–100.0)
Platelets: 123 10*3/uL — ABNORMAL LOW (ref 150–400)
Platelets: 98 10*3/uL — ABNORMAL LOW (ref 150–400)
RBC: 5.1 MIL/uL (ref 3.87–5.11)
RBC: 4.11 MIL/uL (ref 3.87–5.11)
RDW: 13.5 % (ref 11.5–15.5)
RDW: 13.5 % (ref 11.5–15.5)
WBC: 6.4 10*3/uL (ref 4.0–10.5)
WBC: 5.9 10*3/uL (ref 4.0–10.5)
nRBC: 0 % (ref 0.0–0.2)
nRBC: 0 % (ref 0.0–0.2)

## 2023-06-28 LAB — COMPREHENSIVE METABOLIC PANEL
ALT: 28 U/L (ref 0–44)
AST: 20 U/L (ref 15–41)
Albumin: 3.3 g/dL — ABNORMAL LOW (ref 3.5–5.0)
Alkaline Phosphatase: 113 U/L (ref 38–126)
Anion gap: 12 (ref 5–15)
BUN: 59 mg/dL — ABNORMAL HIGH (ref 8–23)
CO2: 24 mmol/L (ref 22–32)
Calcium: 9.1 mg/dL (ref 8.9–10.3)
Chloride: 126 mmol/L — ABNORMAL HIGH (ref 98–111)
Creatinine, Ser: 1.11 mg/dL — ABNORMAL HIGH (ref 0.44–1.00)
GFR, Estimated: 50 mL/min — ABNORMAL LOW (ref >60)
Glucose, Bld: 140 mg/dL — ABNORMAL HIGH (ref 70–99)
Potassium: 4 mmol/L (ref 3.5–5.1)
Sodium: 162 mmol/L (ref 135–145)
Total Bilirubin: 1.5 mg/dL — ABNORMAL HIGH (ref 0.0–1.2)
Total Protein: 7 g/dL (ref 6.5–8.1)

## 2023-06-28 LAB — I-STAT CG4 LACTIC ACID, ED
Lactic Acid, Venous: 2.6 mmol/L (ref 0.5–1.9)
Lactic Acid, Venous: 2.3 mmol/L (ref 0.5–1.9)
Lactic Acid, Venous: 1.8 mmol/L (ref 0.5–1.9)
Lactic Acid, Venous: 1.7 mmol/L (ref 0.5–1.9)

## 2023-06-28 LAB — URINALYSIS, ROUTINE W REFLEX MICROSCOPIC
Bilirubin Urine: NEGATIVE
Glucose, UA: NEGATIVE mg/dL
Hgb urine dipstick: NEGATIVE
Ketones, ur: NEGATIVE mg/dL
Leukocytes,Ua: NEGATIVE
Nitrite: NEGATIVE
Protein, ur: 30 mg/dL — AB
Specific Gravity, Urine: 1.028 (ref 1.005–1.030)
pH: 5 (ref 5.0–8.0)

## 2023-06-28 LAB — LIPASE, BLOOD: Lipase: 26 U/L (ref 11–51)

## 2023-06-28 LAB — TSH: TSH: 1.873 u[IU]/mL (ref 0.350–4.500)

## 2023-06-28 LAB — TROPONIN I (HIGH SENSITIVITY)
Troponin I (High Sensitivity): 24 ng/L — ABNORMAL HIGH (ref <18)
Troponin I (High Sensitivity): 22 ng/L — ABNORMAL HIGH (ref <18)

## 2023-06-28 LAB — CBG MONITORING, ED: Glucose-Capillary: 141 mg/dL — ABNORMAL HIGH (ref 70–99)

## 2023-06-28 LAB — CREATININE, SERUM
Creatinine, Ser: 0.86 mg/dL (ref 0.44–1.00)
GFR, Estimated: >60 mL/min (ref >60)

## 2023-06-28 MED ORDER — ACETAMINOPHEN 650 MG RE SUPP: 650.0000 mg | Freq: Four times a day (QID) | RECTAL | Status: DC | PRN

## 2023-06-28 MED ORDER — CEFTRIAXONE SODIUM 1 G IJ SOLR: 1.0000 g | INTRAMUSCULAR | Status: DC

## 2023-06-28 MED ORDER — SODIUM CHLORIDE 0.9 % IV BOLUS
1000.0000 mL | Freq: Once | INTRAVENOUS | Status: AC
  Administered 2023-06-28: 1000 mL via INTRAVENOUS

## 2023-06-28 MED ORDER — SODIUM CHLORIDE 0.9 % IV SOLN
1.0000 g | Freq: Once | INTRAVENOUS | Status: AC
  Administered 2023-06-28: 1 g via INTRAVENOUS
  Filled 2023-06-28: qty 10

## 2023-06-28 MED ORDER — ACETAMINOPHEN 325 MG PO TABS: 650.0000 mg | ORAL_TABLET | Freq: Four times a day (QID) | ORAL | Status: DC | PRN

## 2023-06-28 MED ORDER — ENOXAPARIN SODIUM 40 MG/0.4ML IJ SOSY
40.0000 mg | PREFILLED_SYRINGE | INTRAMUSCULAR | Status: DC
  Administered 2023-06-28 – 2023-07-03 (×6): 40 mg via SUBCUTANEOUS
  Filled 2023-06-28 (×6): qty 0.4

## 2023-06-28 MED ORDER — DEXTROSE 5 % IV SOLN: INTRAVENOUS | Status: DC

## 2023-06-28 MED ORDER — SODIUM CHLORIDE 0.9 % IV BOLUS
500.0000 mL | Freq: Once | INTRAVENOUS | Status: AC
  Administered 2023-06-28: 500 mL via INTRAVENOUS

## 2023-06-28 NOTE — ED Provider Notes (Signed)
Care of patient received from prior provider at 3:48 PM, please see their note for complete H/P and care plan.  Received handoff per ED course.  Clinical Course as of 06/28/23 1548  Mon Jun 28, 2023  1312 DG Chest Portable 1 View IMPRESSION: Hyperinflation. No acute cardiopulmonary disease. Tortuous ectatic aorta.   Electronically Signe   [TY]  1312 WBC: 6.4 [TY]  1312 Lactic Acid, Venous(!!): 2.6 EMS reported that patient was being treated outpatient for urinary tract infection. [TY]  1504 Troponin I (High Sensitivity)(!): 22 Ecg re-assuring [TY]  1504 Sodium(!!): 162 Hypovolemic; decreased PO intake for the past few days.  [TY]  1505 Final MDM; 78 year old female presenting emergency department for altered mental status.  Seemingly more sleepy than normal.  Baseline dementia and several palsy.  Family presented to bedside and noted that patient symptoms have been worsening over the past 2 to 3 days.  Has been treated for presumed urinary tract infection.  Has not had much to eat or drink for the past 2 days as well.  Lab workup consistent with hypovolemic hypernatremia.  Received IV fluids.  Did have mildly elevated lactate and elevated heart rate, treated empirically with Rocephin.  UA still pending, but has had partial outpatient treatment with antibiotics.  CT head was ordered given patient's undifferentiated altered mental status, however with sodium 162 and likely UTI I suspect she has more delirium picture rather than acute intracranial pathology.  Will admit for hypernatremia [TY]  1546 Stable HO from TY HX CP and dementia.  Hypernatremia. Complete PO intolerance. On UTI meds but still AMS. [CC]    Clinical Course User Index [CC] Glyn Ade, MD [TY] Coral Spikes, DO   CRITICAL CARE Performed by: Glyn Ade   Total critical care time: 45 minutes  Critical care time was exclusive of separately billable procedures and treating other patients.  Critical  care was necessary to treat or prevent imminent or life-threatening deterioration.  Critical care was time spent personally by me on the following activities: development of treatment plan with patient and/or surrogate as well as nursing, discussions with consultants, evaluation of patient's response to treatment, examination of patient, obtaining history from patient or surrogate, ordering and performing treatments and interventions, ordering and review of laboratory studies, ordering and review of radiographic studies, pulse oximetry and re-evaluation of patient's condition.  Reassessment: Discussed case with inpatient team.  Disposition:   Based on the above findings, I believe this patient is stable for admission.    Patient/family educated about specific findings on our evaluation and explained exact reasons for admission.  Patient/family educated about clinical situation and time was allowed to answer questions.   Admission team communicated with and agreed with need for admission. Patient admitted. Patient  ready to move at this time.     Emergency Department Medication Summary:   Medications  sodium chloride 0.9 % bolus 500 mL (has no administration in time range)  sodium chloride 0.9 % bolus 1,000 mL (0 mLs Intravenous Stopped 06/28/23 1408)  cefTRIAXone (ROCEPHIN) 1 g in sodium chloride 0.9 % 100 mL IVPB (0 g Intravenous Stopped 06/28/23 1344)            Glyn Ade, MD 06/28/23 1554

## 2023-06-28 NOTE — ED Triage Notes (Signed)
Pt BIB EMS for possible UTI, usually talking. Arrives non verbal. AMS since yesterday. Hx of CP and dementia.

## 2023-06-28 NOTE — Hospital Course (Addendum)
#  Acute Encephalopathy #Hypernatremia Patient somnolent on admission so history obtained from phone call with son, Abigail Thomas. At baseline, patient is unable to walk but is able to feed herself. Patient's mentation began worsening 6 days ago which prompted PCP visit. At this visit they were unable to obtain urine so she was empirically treated for UTI with Nitrofurantoin and Fluconazole for yeast infection of groin. Patient's mentation declined in subsequent days to where she stopped eating/drinking this past Friday, 1/24. No po intake since then. Last dose of Nitrofurantoin was Friday.  CMP showed hypernatremia at 162, BUN 59, Albumin 3.3, Total Bilirubin of 1.5. No transaminitis. LA 2.6 > 2.3 > 1.8.  CXR unremarkable. CT Head showed no evidence of bleed.   Patient given 1.5 L NS Bolus in ED in addition to 1 g IV Rocephin. 3 attempts at in-and-out cath did not yield urine.  Physical exam significant for severely decreased skin turgor, dry mucous membranes, and apparent mild TTP in all four quadrants. Lipase wnl. Subsequent UA unremarkable and Rocephin discontinued after day 3.   Free water deficit is 4.6 L so patient started on D5W @ 100  cc/hr  Patient evaluated by SLP who recommended n.p.o.  Sodium levels down trended and normalized after day 2.  IVF's changed to D5 1/2 NS @35  cc/hr. mentation somewhat improved as patient began to intermittently open eyes.  Continued to be non-interactive. SLP reevaluated and advanced to dysphagia 1 diet.  Palliative consulted and spoke with son who decided to discharge with home hospice.  ----  New medications at discharge until Hospice liaison. RN is able to review  - Morphine Concentrate 10mg /0.64ml: 5mg  (0.33ml) sublingual every 1 hour as needed for pain or shortness of breath: Disp 30ml - Lorazepam 2mg /ml concentrated solution: 1mg  (0.33ml) sublingual every 4 hours as needed for anxiety: Disp 30ml  PHARMACY

## 2023-06-28 NOTE — ED Provider Notes (Signed)
[place] EMERGENCY DEPARTMENT AT [facility] Provider Note   CSN: [id number] Arrival date & time: 06/28/23  1139     History  Chief Complaint  Patient presents with  . Altered Mental Status    Abigail Thomas is a 78 y.o. female.  This is an 78 year old female presenting emergency department for altered mental status.  Patient with dementia and cerebral palsy with poor speech and communication and per EMS reports dependent on total care.  Reportedly being treated for UTI outpatient, this morning was more lethargic and refusing to eat and take antibiotics.   Altered Mental Status      Home Medications Prior to Admission medications   Medication Sig Start Date End Date Taking? Authorizing Provider  aspirin-acetaminophen-caffeine (EXCEDRIN MIGRAINE) [phone] MG tablet Take 1 tablet by mouth every 6 (six) hours as needed for headache.   Yes [provider]  cholecalciferol (VITAMIN D3) 25 MCG (1000 UNIT) tablet Take 1,000 Units by mouth daily.   Yes [provider]  Docusate Sodium (COLACE PO) Take 1 tablet by mouth daily.   Yes [provider]  fluconazole (DIFLUCAN) 100 MG tablet Take 100 mg by mouth 2 (two) times a week. 06/22/23  Yes [provider]  nitrofurantoin (MACRODANTIN) 100 MG capsule Take 100 mg by mouth every 6 (six) hours. 06/22/23  Yes [provider]      Allergies    Patient has no known allergies.    Review of Systems   Review of Systems  Physical Exam Updated Vital Signs BP 115/79   Pulse 90   Temp 99.7 F (37.6 C) (Rectal)   Resp 16   SpO2 96%  Physical Exam Vitals and nursing note reviewed.  Constitutional:      Comments: Patient actively closing her eyes will resist movements, but will not follow commands  HENT:     Head: Normocephalic.     Mouth/Throat:     Mouth: Mucous membranes are moist.  Eyes:     Conjunctiva/sclera: Conjunctivae normal.  Cardiovascular:     Rate and Rhythm:  Normal rate and regular rhythm.  Pulmonary:     Effort: Pulmonary effort is normal.     Breath sounds: Normal breath sounds.  Abdominal:     General: Abdomen is flat. There is no distension.     Tenderness: There is no abdominal tenderness. There is no guarding or rebound.  Musculoskeletal:        General: Normal range of motion.  Skin:    General: Skin is warm.     Capillary Refill: Capillary refill takes less than 2 seconds.  Neurological:     Comments: Difficult to assess due to patient's participation.  She will move all extremities and seemingly a coordinated fashion, but will not follow commands.  Psychiatric:        Mood and Affect: Mood normal.        Behavior: Behavior normal.     ED Results / Procedures / Treatments   Labs (all labs ordered are listed, but only abnormal results are displayed) Labs Reviewed  CBC - Abnormal; Notable for the following components:      Result Value   Hemoglobin 16.7 (*)    HCT 52.2 (*)    MCV 102.4 (*)    Platelets 123 (*)    All other components within normal limits  COMPREHENSIVE METABOLIC PANEL - Abnormal; Notable for the following components:   Sodium 162 (*)    Chloride 126 (*)  Glucose, Bld 140 (*)    BUN 59 (*)    Creatinine, Ser 1.11 (*)    Albumin 3.3 (*)    Total Bilirubin 1.5 (*)    GFR, Estimated 50 (*)    All other components within normal limits  I-STAT CG4 LACTIC ACID, ED - Abnormal; Notable for the following components:   Lactic Acid, Venous 2.6 (*)    All other components within normal limits  CBG MONITORING, ED - Abnormal; Notable for the following components:   Glucose-Capillary 141 (*)    All other components within normal limits  I-STAT CG4 LACTIC ACID, ED - Abnormal; Notable for the following components:   Lactic Acid, Venous 2.3 (*)    All other components within normal limits  TROPONIN I (HIGH SENSITIVITY) - Abnormal; Notable for the following components:   Troponin I (High Sensitivity) 24 (*)    All  other components within normal limits  TROPONIN I (HIGH SENSITIVITY) - Abnormal; Notable for the following components:   Troponin I (High Sensitivity) 22 (*)    All other components within normal limits  CULTURE, BLOOD (ROUTINE X 2)  CULTURE, BLOOD (ROUTINE X 2)  LIPASE, BLOOD  URINALYSIS, ROUTINE W REFLEX MICROSCOPIC    EKG EKG Interpretation Date/Time:  Monday June 28 2023 11:55:03 EST Ventricular Rate:  120 PR Interval:  122 QRS Duration:  95 QT Interval:  284 QTC Calculation: 402 R Axis:   -4  Text Interpretation: Sinus tachycardia Ventricular premature complex Aberrant conduction of SV complex(es) Abnormal R-wave progression, early transition Repol abnrm, severe global ischemia (LM/MVD) Artifact in lead(s) I II aVR Confirmed by Estanislado Pandy [phone]) on 06/28/2023 11:56:41 AM  Radiology DG Chest Portable 1 View Result Date: 06/28/2023 CLINICAL DATA:  Altered mental status EXAM: PORTABLE CHEST 1 VIEW COMPARISON:  X-ray 04/09/2023 FINDINGS: Hyperinflation. No consolidation, pneumothorax or effusion. No edema. Normal cardiopericardial silhouette with a tortuous ectatic aorta. Overlapping cardiac leads. Degenerative changes of the spine and shoulders. IMPRESSION: Hyperinflation. No acute cardiopulmonary disease. Tortuous ectatic aorta. Electronically Signed   By: Karen Kays M.D.   On: 06/28/2023 12:24    Procedures Procedures    Medications Ordered in ED Medications  sodium chloride 0.9 % bolus 1,000 mL (0 mLs Intravenous Stopped 06/28/23 1408)  cefTRIAXone (ROCEPHIN) 1 g in sodium chloride 0.9 % 100 mL IVPB (0 g Intravenous Stopped 06/28/23 1344)    ED Course/ Medical Decision Making/ A&P Clinical Course as of 06/28/23 1543  Mon Jun 28, 2023  1312 DG Chest Portable 1 View IMPRESSION: Hyperinflation. No acute cardiopulmonary disease. Tortuous ectatic aorta.   Electronically Signe   [TY]  1312 WBC: 6.4 [TY]  1312 Lactic Acid, Venous(!!): 2.6 EMS reported that  patient was being treated outpatient for urinary tract infection. [TY]  1504 Troponin I (High Sensitivity)(!): 22 Ecg re-assuring [TY]  1504 Sodium(!!): 162 Hypovolemic; decreased PO intake for the past few days.  [TY]  1505 Final MDM; 78 year old female presenting emergency department for altered mental status.  Seemingly more sleepy than normal.  Baseline dementia and several palsy.  Family presented to bedside and noted that patient symptoms have been worsening over the past 2 to 3 days.  Has been treated for presumed urinary tract infection.  Has not had much to eat or drink for the past 2 days as well.  Lab workup consistent with hypovolemic hypernatremia.  Received IV fluids.  Did have mildly elevated lactate and elevated heart rate, treated empirically with Rocephin.  UA still pending, but  has had partial outpatient treatment with antibiotics.  CT head was ordered given patient's undifferentiated altered mental status, however with sodium 162 and likely UTI I suspect she has more delirium picture rather than acute intracranial pathology.  Will admit for hypernatremia [TY]    Clinical Course User Index [TY] Coral Spikes, DO                                 Medical Decision Making This is an 78 year old female presented to the emergency department for altered mental status.  EMS reported stable vitals, but slight tachycardia.  They also note that patient has been being treated outpatient for urinary tract infection.  On my examination patient actively closing eyes and resisting movements, she will at times spontaneously move extremities with no apparent weakness.  Suspect delirium type picture rather than acute stroke.  Will get CT head as well as infectious workup.  See ED course for further MDM/disposition.  Amount and/or Complexity of Data Reviewed External Data Reviewed:     Details: Was admitted in November for seizure-like activity per discharge summary: "Seizure-like activity Per son,  pt had abnormal vocalizations and head movements followed by period of excess sleepiness in the setting of advanced dementia.  Neurology was consulted and patient was loaded with Keppra. Short term EEG showed nonspecific cerebral dysfunction, possible ictal-interictal pattern. MRI brain was recommended by Neurology. Per goals of care discussions with patient son, this was preferred to be pursued outpatient. Overnight EEG showed continued encephalopathic changes.  Overall neurology suspected that it is unlikely patient had seizure and symptoms are mostly due to delirium in the setting of severe Alzheimer's dementia.  Recommended outpatient neurology follow-up and potential outpatient MRI brain.  Patient was not discharged on antiepileptics.   Severe Alzheimer's dementia Multiple goals of care discussion with patient's primary decision maker and caretaker son, Lorelei Heikkila.  Palliative care consulted, son not ready to make transition to hospice care at this time. Patient discharged with set up for outpatient palliative care.   Concern for UTI Patient unable to verbalize urinary symptoms.  However, UA consistent with possible urinary tract infection.  Received 1 dose ceftriaxone in the ED.  Patient remained urinary incontinent throughout admission however unclear if this is due to dementia or urinary symptoms.  Urine culture positive for E. coli.  Suspected colonization, patient was not discharged with further antibiotics. " Labs: ordered. Decision-making details documented in ED Course. Radiology: ordered. Decision-making details documented in ED Course. ECG/medicine tests:  Decision-making details documented in ED Course.  Risk Decision regarding hospitalization.         Final Clinical Impression(s) / ED Diagnoses Final diagnoses:  Delirium  Hyponatremia    Rx / DC Orders ED Discharge Orders     None         Coral Spikes, DO 06/28/23 1519

## 2023-06-28 NOTE — H&P (Addendum)
Date: 06/28/2023               Patient Name:  Abigail Thomas MRN: [id number]  DOB: September 01, 1945 Age / Sex: 78 y.o., female   PCP: Marletta Lor, NP         Medical Service: Internal Medicine Teaching Service         Attending Physician: Dr. Gust Rung, DO      First Contact: Dr. Carmina Miller, DO Pager [phone]    Second Contact: Dr. Morene Crocker, MD Pager [phone]         After Hours (After 5p/  First Contact Pager: [phone]  weekends / holidays): Second Contact Pager: [phone]   SUBJECTIVE   Chief Complaint: Acute Encephalopathy  History of Present Illness: Abigail Thomas is an 78 year old female with PMH of advanced dementia and cerebral palsy who presents to ED 1/27 via EMS with decreased mentation. Patient somnolent on exam so history obtained from phone call with son, [name]. At baseline, patient is unable to walk but is able to feed herself. Patient's mentation began worsening 6 days ago which prompted PCP visit. At this visit they were unable to obtain urine so she was empirically treated for UTI with Nitrofurantoin and Fluconazole for yeast infection of groin. Patient's mentation declined in subsequent days to where she stopped eating/drinking this past Friday, 1/24. No po intake since then. Last dose of Nitrofurantoin was Friday. Last BM was Friday.    Medications:  Current Outpatient Medications  Medication Instructions   aspirin-acetaminophen-caffeine (EXCEDRIN MIGRAINE) 250-250-65 MG tablet 1 tablet, Every 6 hours PRN   cholecalciferol (VITAMIN D3) 1,000 Units, Daily   Docusate Sodium (COLACE PO) 1 tablet, Daily   fluconazole (DIFLUCAN) 100 mg, Oral, 2 times weekly   nitrofurantoin (MACRODANTIN) 100 mg, Oral, Every 6 hours    Past Medical History Past Medical History:  Diagnosis Date   CP (cerebral palsy) (HCC)     Past Surgical History History reviewed. No pertinent surgical history.  Social:  Lives With:Son Level of Function: Requires help  with ADL's iADLS PCP: Marletta Lor, NP  Family History:  History reviewed. No pertinent family history.  Allergies: Allergies as of 06/28/2023   (No Known Allergies)    Review of Systems: A complete ROS was negative except as per HPI.   OBJECTIVE:   Physical Exam: Blood pressure 115/79, pulse 90, temperature 99.7 F (37.6 C), temperature source Rectal, resp. rate 16, SpO2 96%.  Constitutional: somnolent, lying in bed, NAD HENT: normocephalic atraumatic, dry mucous membranes Cardiovascular: regular rate and rhythm, no m/r/g, no LEE, capillary refill < 2 seconds Pulmonary/Chest: normal work of breathing on room air, lungs clear to anterior auscultation bilaterally Abdominal: non-distended, soft, apparent mild TTP in all four quadrants Neurological: Does not open eyes on command, withdraws to painful stimuli Skin: warm and dry, numerous ecchymoses on bilateral LE, most notably on left lateral malleolus    Labs: CBC    Component Value Date/Time   WBC 6.4 06/28/2023 1200   RBC 5.10 06/28/2023 1200   HGB 16.7 (H) 06/28/2023 1200   HCT 52.2 (H) 06/28/2023 1200   PLT 123 (L) 06/28/2023 1200   MCV 102.4 (H) 06/28/2023 1200   MCH 32.7 06/28/2023 1200   MCHC 32.0 06/28/2023 1200   RDW 13.5 06/28/2023 1200   LYMPHSABS 1.4 04/09/2023 1310   MONOABS 0.2 04/09/2023 1310   EOSABS 0.1 04/09/2023 1310   BASOSABS 0.0 04/09/2023 1310     CMP     Component  Value Date/Time   NA 162 (HH) 06/28/2023 1200   K 4.0 06/28/2023 1200   CL 126 (H) 06/28/2023 1200   CO2 24 06/28/2023 1200   GLUCOSE 140 (H) 06/28/2023 1200   BUN 59 (H) 06/28/2023 1200   CREATININE 1.11 (H) 06/28/2023 1200   CALCIUM 9.1 06/28/2023 1200   PROT 7.0 06/28/2023 1200   ALBUMIN 3.3 (L) 06/28/2023 1200   AST 20 06/28/2023 1200   ALT 28 06/28/2023 1200   ALKPHOS 113 06/28/2023 1200   BILITOT 1.5 (H) 06/28/2023 1200   GFRNONAA 50 (L) 06/28/2023 1200   GFRAA >60 03/16/2015 0865    Imaging:  EKG: personally  reviewed my interpretation is sinus tachycardia.  ASSESSMENT & PLAN:   Assessment & Plan by Problem: Principal Problem:   Hypernatremia Active Problems:   Severe dementia (HCC)   Acute metabolic encephalopathy   Acute encephalopathy   Abigail Thomas is a 78 y.o.female with advanced dementia and cerebral palsy who presents to ED 1/27 via EMS with decreased mentation and admitted for acute metabolic encephalopathy with associated hypernatremia.  #Acute Encephalopathy #Hypernatremia #Uremia #Lactic Acidosis #Hypoalbuminemia #Elevated Total Bilirubin Etiology is most likely metabolic in the setting of poor po intake. CMP shows hypernatremia at 162, BUN 59, Albumin 3.3, Total Bilirubin of 1.5. No transaminitis. LA 2.6 > 2.3 > 1.8. Potentially infectious given recent treatment for UTI although patient is afebrile, without leukocytosis. CXR unremarkable. Initially tachycardic but NSR during interview. Less likely structural - CT Head showed no evidence of bleed. Less likely toxin mediated as patient does not take any narcotic medication regularly. UDS pending.  Patient given 1.5 L NS Bolus in ED in addition to 1 g IV Rocephin. 3 attempts at in-and-out cath did not yield urine.  Physical exam significant for severely decreased skin turgor, dry mucous membranes, and apparent mild TTP in all four quadrants. Lipase wnl.  Of note, patient with recent admission 04/09/23-04/11/23 for seizure like activity (Neurology suspected that it was unlikely patient had seizure and symptoms were mostly due to delirium in the setting of severe Alzheimer's dementia). 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 Culture sensitive to Rocephin. -Free water deficit is 4.6 L so will start D5W @ 75 cc/hr to avoid overly rapid correction -Continue IV Rocephin 1 g with plan to treat for 3 days -UA pending -TSH pending -UDS pending -BC pending -Repeat  EKG after fluids -Bladder scan q shift -Delirium precautions -PT/OT/SLP evaluation -Trend CMP -Trend LA -Continuous telemetry -Consider CTAP   #Polycythemia #Macrocytosis Hgb 16.7, Hct 52.2, MCV 102.4. Likely hemoconcentration 2/2 no po intake. -Trend CBC  #Thrombocytopenia Platelets are 123. No signs of bleeding. -Trend CBC  #Hyperglycemia Likely reactive. A1c was 4.6, 2 months ago. Currently NPO -Trend BMP/CMP  Diet: NPO VTE: Enoxaparin IVF: D5,75cc/hr Code: DNR/DNI confirmed with son 06/28/2023   Dispo: Admit patient to Inpatient with expected length of stay greater than 2 midnights.   Signed: Carmina Miller, DO Internal Medicine Resident PGY-1 06/28/2023, 6:20 PM

## 2023-06-28 NOTE — ED Notes (Signed)
RN and NT attempted to in and out cath pt 3 times. All 3 times unsuccessful. Purwick placed at this time

## 2023-06-29 DIAGNOSIS — B9620 Unspecified Escherichia coli [E. coli] as the cause of diseases classified elsewhere: Secondary | ICD-10-CM | POA: Diagnosis present

## 2023-06-29 DIAGNOSIS — N19 Unspecified kidney failure: Secondary | ICD-10-CM | POA: Diagnosis present

## 2023-06-29 DIAGNOSIS — G9389 Other specified disorders of brain: Secondary | ICD-10-CM | POA: Diagnosis present

## 2023-06-29 DIAGNOSIS — K5900 Constipation, unspecified: Secondary | ICD-10-CM | POA: Diagnosis present

## 2023-06-29 DIAGNOSIS — E876 Hypokalemia: Secondary | ICD-10-CM | POA: Diagnosis present

## 2023-06-29 DIAGNOSIS — R410 Disorientation, unspecified: Principal | ICD-10-CM

## 2023-06-29 DIAGNOSIS — G309 Alzheimer's disease, unspecified: Secondary | ICD-10-CM | POA: Diagnosis present

## 2023-06-29 DIAGNOSIS — G9341 Metabolic encephalopathy: Secondary | ICD-10-CM | POA: Diagnosis present

## 2023-06-29 DIAGNOSIS — Z515 Encounter for palliative care: Secondary | ICD-10-CM | POA: Diagnosis not present

## 2023-06-29 DIAGNOSIS — F02C11 Dementia in other diseases classified elsewhere, severe, with agitation: Secondary | ICD-10-CM | POA: Diagnosis present

## 2023-06-29 DIAGNOSIS — L89152 Pressure ulcer of sacral region, stage 2: Secondary | ICD-10-CM | POA: Diagnosis present

## 2023-06-29 DIAGNOSIS — Z23 Encounter for immunization: Secondary | ICD-10-CM | POA: Diagnosis present

## 2023-06-29 DIAGNOSIS — E871 Hypo-osmolality and hyponatremia: Secondary | ICD-10-CM | POA: Diagnosis present

## 2023-06-29 DIAGNOSIS — L89322 Pressure ulcer of left buttock, stage 2: Secondary | ICD-10-CM | POA: Diagnosis present

## 2023-06-29 DIAGNOSIS — E8809 Other disorders of plasma-protein metabolism, not elsewhere classified: Secondary | ICD-10-CM | POA: Diagnosis present

## 2023-06-29 DIAGNOSIS — G809 Cerebral palsy, unspecified: Secondary | ICD-10-CM | POA: Diagnosis present

## 2023-06-29 DIAGNOSIS — D696 Thrombocytopenia, unspecified: Secondary | ICD-10-CM | POA: Diagnosis present

## 2023-06-29 DIAGNOSIS — R1310 Dysphagia, unspecified: Secondary | ICD-10-CM | POA: Diagnosis present

## 2023-06-29 DIAGNOSIS — E872 Acidosis, unspecified: Secondary | ICD-10-CM | POA: Diagnosis present

## 2023-06-29 DIAGNOSIS — D7589 Other specified diseases of blood and blood-forming organs: Secondary | ICD-10-CM | POA: Diagnosis present

## 2023-06-29 DIAGNOSIS — F02C0 Dementia in other diseases classified elsewhere, severe, without behavioral disturbance, psychotic disturbance, mood disturbance, and anxiety: Secondary | ICD-10-CM | POA: Diagnosis not present

## 2023-06-29 DIAGNOSIS — D751 Secondary polycythemia: Secondary | ICD-10-CM | POA: Diagnosis present

## 2023-06-29 DIAGNOSIS — N390 Urinary tract infection, site not specified: Secondary | ICD-10-CM | POA: Diagnosis present

## 2023-06-29 DIAGNOSIS — E860 Dehydration: Secondary | ICD-10-CM | POA: Diagnosis present

## 2023-06-29 DIAGNOSIS — G9340 Encephalopathy, unspecified: Secondary | ICD-10-CM | POA: Diagnosis not present

## 2023-06-29 DIAGNOSIS — Z66 Do not resuscitate: Secondary | ICD-10-CM | POA: Diagnosis present

## 2023-06-29 DIAGNOSIS — E870 Hyperosmolality and hypernatremia: Secondary | ICD-10-CM | POA: Diagnosis present

## 2023-06-29 DIAGNOSIS — F05 Delirium due to known physiological condition: Secondary | ICD-10-CM | POA: Diagnosis present

## 2023-06-29 LAB — COMPREHENSIVE METABOLIC PANEL
ALT: 15 U/L (ref 0–44)
AST: 15 U/L (ref 15–41)
Albumin: 2.5 g/dL — ABNORMAL LOW (ref 3.5–5.0)
Alkaline Phosphatase: 77 U/L (ref 38–126)
Anion gap: 9 (ref 5–15)
BUN: 43 mg/dL — ABNORMAL HIGH (ref 8–23)
CO2: 20 mmol/L — ABNORMAL LOW (ref 22–32)
Calcium: 7.9 mg/dL — ABNORMAL LOW (ref 8.9–10.3)
Chloride: 126 mmol/L — ABNORMAL HIGH (ref 98–111)
Creatinine, Ser: 0.77 mg/dL (ref 0.44–1.00)
GFR, Estimated: >60 mL/min (ref >60)
Glucose, Bld: 144 mg/dL — ABNORMAL HIGH (ref 70–99)
Potassium: 3.4 mmol/L — ABNORMAL LOW (ref 3.5–5.1)
Sodium: 155 mmol/L — ABNORMAL HIGH (ref 135–145)
Total Bilirubin: 1.3 mg/dL — ABNORMAL HIGH (ref 0.0–1.2)
Total Protein: 5 g/dL — ABNORMAL LOW (ref 6.5–8.1)

## 2023-06-29 LAB — BASIC METABOLIC PANEL
Anion gap: 5 (ref 5–15)
BUN: 32 mg/dL — ABNORMAL HIGH (ref 8–23)
CO2: 23 mmol/L (ref 22–32)
Calcium: 7.6 mg/dL — ABNORMAL LOW (ref 8.9–10.3)
Chloride: 120 mmol/L — ABNORMAL HIGH (ref 98–111)
Creatinine, Ser: 0.67 mg/dL (ref 0.44–1.00)
GFR, Estimated: >60 mL/min (ref >60)
Glucose, Bld: 142 mg/dL — ABNORMAL HIGH (ref 70–99)
Potassium: 3.7 mmol/L (ref 3.5–5.1)
Sodium: 148 mmol/L — ABNORMAL HIGH (ref 135–145)

## 2023-06-29 LAB — CBC
HCT: 41.5 % (ref 36.0–46.0)
Hemoglobin: 12.7 g/dL (ref 12.0–15.0)
MCH: 32.2 pg (ref 26.0–34.0)
MCHC: 30.6 g/dL (ref 30.0–36.0)
MCV: 105.1 fL — ABNORMAL HIGH (ref 80.0–100.0)
Platelets: 92 10*3/uL — ABNORMAL LOW (ref 150–400)
RBC: 3.95 MIL/uL (ref 3.87–5.11)
RDW: 13.4 % (ref 11.5–15.5)
WBC: 5.8 10*3/uL (ref 4.0–10.5)
nRBC: 0 % (ref 0.0–0.2)

## 2023-06-29 LAB — MAGNESIUM: Magnesium: 2.3 mg/dL (ref 1.7–2.4)

## 2023-06-29 MED ORDER — INFLUENZA VAC A&B SURF ANT ADJ 0.5 ML IM SUSY
0.5000 mL | PREFILLED_SYRINGE | INTRAMUSCULAR | Status: AC
  Administered 2023-07-04: 0.5 mL via INTRAMUSCULAR
  Filled 2023-06-29: qty 0.5

## 2023-06-29 MED ORDER — PNEUMOCOCCAL 20-VAL CONJ VACC 0.5 ML IM SUSY
0.5000 mL | PREFILLED_SYRINGE | INTRAMUSCULAR | Status: AC
  Administered 2023-07-04: 0.5 mL via INTRAMUSCULAR
  Filled 2023-06-29: qty 0.5

## 2023-06-29 MED ORDER — POTASSIUM CHLORIDE 10 MEQ/100ML IV SOLN
10.0000 meq | INTRAVENOUS | Status: AC
Start: 2023-06-29 — End: 2023-06-29
  Administered 2023-06-29 (×4): 10 meq via INTRAVENOUS
  Filled 2023-06-29 (×4): qty 100

## 2023-06-29 NOTE — Progress Notes (Signed)
                 Subjective Patient with continued somnolence but more alert today.   Physical exam Blood pressure 124/69, pulse 76, temperature 98.6 F (37 C), temperature source Oral, resp. rate 18, weight 63.5 kg, SpO2 100%.  Constitutional: somnolent, lying in bed, NAD HENT: normocephalic atraumatic, dry mucous membranes Cardiovascular: regular rate and rhythm, no m/r/g, no LEE, capillary refill < 2 seconds Pulmonary/Chest: normal work of breathing on room air, lungs clear to anterior auscultation bilaterally Abdominal: non-distended, soft, no apparent TTP Neurological: withdraws to painful stimuli, squeezes fingers on command Skin: warm and dry, numerous ecchymoses on bilateral LE, most notably on left lateral malleolus  Weight change:    Intake/Output Summary (Last 24 hours) at 06/29/2023 1507 Last data filed at 06/29/2023 1029 Gross per 24 hour  Intake --  Output 250 ml  Net -250 ml   Net IO Since Admission: -250 mL [06/29/23 1507]  Labs, images, and other studies    Latest Ref Rng & Units 06/29/2023    4:23 AM 06/28/2023    7:00 PM 06/28/2023   12:00 PM  CBC  WBC 4.0 - 10.5 K/uL 5.8  5.9  6.4   Hemoglobin 12.0 - 15.0 g/dL 16.1  09.6  04.5   Hematocrit 36.0 - 46.0 % 41.5  44.7  52.2   Platelets 150 - 400 K/uL 92  98  123        Latest Ref Rng & Units 06/29/2023    4:23 AM 06/28/2023    7:00 PM 06/28/2023   12:00 PM  BMP  Glucose 70 - 99 mg/dL 409   811   BUN 8 - 23 mg/dL 43   59   Creatinine 9.14 - 1.00 mg/dL 7.82  9.56  2.13   Sodium 135 - 145 mmol/L 155   162   Potassium 3.5 - 5.1 mmol/L 3.4   4.0   Chloride 98 - 111 mmol/L 126   126   CO2 22 - 32 mmol/L 20   24   Calcium 8.9 - 10.3 mg/dL 7.9   9.1      Assessment and plan Hospital day 1  Abigail Thomas is a 78 y.o.female with advanced dementia and cerebral palsy who presents to ED 1/27 via EMS with decreased mentation and admitted for acute metabolic encephalopathy with associated hypernatremia.    #Acute Metabolic Encephalopathy #Hypernatremia #Hypokalemia #Uremia #Hypoalbuminemia Afebrile. No leukocytosis. Mentation mildly improved today. Na+ this morning 155, afternoon repeat is pending. K+ 3.4, repleted. TSH wnl. UA unremarkable. Rocephin discontinued. BC with NG < 24 hours.  - Continue D5W @ 100 cc/hr - Continue IV Rocephin 1 g with plan to treat for 3 days - Bladder scan q shift - Delirium precautions - PT/OT/SLP evaluation, pending clinical improvement -Trend CMP  #Macrocytosis #Thrombocytopenia MCV 105.1. Hgb wnl. Platelets stable at 92. No active signs of bleedings. -Trend CBC   #Hyperglycemia 144. Likely reactive. A1c was 4.6, 2 months ago. Currently NPO -Trend CMP per above   Diet: NPO VTE: Enoxaparin IVF: D5,100 cc/hr Code: DNR/DNI confirmed with son 06/28/2023     Dispo: Admit patient to Inpatient with expected length of stay greater than 2 midnights.  Carmina Miller, DO 06/29/2023, 3:07 PM  Pager: [phone] After 5pm or weekend: [phone]

## 2023-06-29 NOTE — Evaluation (Signed)
Clinical/Bedside Swallow Evaluation Patient Details  Name: Abigail Thomas MRN: [id number] Date of Birth: [birth year]  Today's Date: 06/29/2023 Time: SLP Start Time (ACUTE ONLY): 1125 SLP Stop Time (ACUTE ONLY): 1145 SLP Time Calculation (min) (ACUTE ONLY): 20 min  Past Medical History:  Past Medical History:  Diagnosis Date   CP (cerebral palsy) (HCC)    Past Surgical History: History reviewed. No pertinent surgical history. HPI:  Patient is an 78 y.o. female with PMH: advanced dementia, cerebral palsy who presented to the ED on 06/28/23 via EMS with decreased mentation. At baseline, patient is able to feed herself but not able to walk. Her mentation started worsening 6 days prior to admission and she was treated empirically for UTI by PCP. On Friday, 1/24, she stopped eating and drinking. She has been NPO since admission.    Assessment / Plan / Recommendation  Clinical Impression  Patient is presenting with clinical s/s of dysphagia as per this bedside swallow evaluation. Currently, dyspahgia appears to be largely cognitive-based. She had eyes closed and not responsive initially, but SLP able to rouse her with auditory and tactile stimulation. Once fully alert, she did become more active and with some agitation observed. Oral care completed which removed dried secretions mainly on her palate and gumline. Patient then accepted 1/4-1/2 spoon sips of nectar thick juice into oral cavity but with minimal bilabial or lingual movement. She did exhibit a swallow, which was delayed, however very limited attempts to transit liquid boluses. SLP recommending continue NPO status and will follow for PO trials. SLP Visit Diagnosis: Dysphagia, unspecified (R13.10)    Aspiration Risk  Moderate aspiration risk;Risk for inadequate nutrition/hydration    Diet Recommendation NPO    Medication Administration: Via alternative means    Other  Recommendations Oral Care Recommendations: Oral care QID     Recommendations for follow up therapy are one component of a multi-disciplinary discharge planning process, led by the attending physician.  Recommendations may be updated based on patient status, additional functional criteria and insurance authorization.  Follow up Recommendations Skilled nursing-short term rehab (<3 hours/day)      Assistance Recommended at Discharge    Functional Status Assessment Patient has had a recent decline in their functional status and demonstrates the ability to make significant improvements in function in a reasonable and predictable amount of time.  Frequency and Duration min 2x/week  1 week       Prognosis Prognosis for improved oropharyngeal function: Fair Barriers to Reach Goals: Cognitive deficits;Behavior;Time post onset      Swallow Study   General Date of Onset: 06/28/23 HPI: Patient is an 78 y.o. female with PMH: advanced dementia, cerebral palsy who presented to the ED on 06/28/23 via EMS with decreased mentation. At baseline, patient is able to feed herself but not able to walk. Her mentation started worsening 6 days prior to admission and she was treated empirically for UTI by PCP. On Friday, 1/24, she stopped eating and drinking. She has been NPO since admission. Type of Study: Bedside Swallow Evaluation Previous Swallow Assessment: BSE November 2024 Diet Prior to this Study: NPO Temperature Spikes Noted: No Respiratory Status: Room air History of Recent Intubation: No Behavior/Cognition: Requires cueing;Doesn't follow directions;Agitated;Confused;Alert Oral Cavity Assessment: Dried secretions Oral Care Completed by SLP: Yes Oral Cavity - Dentition: Edentulous Self-Feeding Abilities: Total assist Patient Positioning: Upright in bed Baseline Vocal Quality: Not observed Volitional Cough: Cognitively unable to elicit Volitional Swallow: Unable to elicit    Oral/Motor/Sensory Function Overall Oral Motor/Sensory  Function: Other (comment)  (unable to follow directions but no focal weakness observed)   Ice Chips     Thin Liquid Thin Liquid: Not tested    Nectar Thick Nectar Thick Liquid: Impaired Presentation: Spoon Oral Phase Impairments: Reduced labial seal;Poor awareness of bolus;Reduced lingual movement/coordination Oral phase functional implications: Oral residue Pharyngeal Phase Impairments: Suspected delayed Swallow   Honey Thick     Puree Puree: Not tested   Solid     Solid: Not tested      Angela Nevin, MA, CCC-SLP Speech Therapy

## 2023-06-29 NOTE — Progress Notes (Signed)
New Admission Note:   Arrival Method: stretcher Mental Orientation: awake and alert, disoriented x 4, does not answer questions, non-verbal Telemetry: n/a Assessment: Completed Skin: stage I to coccyx IV: D5 at 100 Pain: no S/S noted Tubes: n/a Safety Measures: Safety Fall Prevention Plan has been given, discussed and signed Admission: Completed [facility] Orientation: Patient has been orientated to the room, unit and staff.  Family: not present  Orders have been reviewed and implemented. Will continue to monitor the patient. Call light has been placed within reach and bed alarm has been activated.   Margarita Grizzle, RN

## 2023-06-29 NOTE — Plan of Care (Signed)

## 2023-06-29 NOTE — Evaluation (Addendum)
Physical Therapy Evaluation Patient Details Name: Abigail Thomas MRN: [id number] DOB: 1946/04/22 Today's Date: 06/29/2023  History of Present Illness  78 yo female with advanced dementia and cerebral palsy presented to ED 1/27 for worsening alterted mental status x 6 days.  Recently treated for UTI. Son reports she had stopped eating 1/24.  Admitted for treatment of acute encephalopathy, hypernatremia, uremia, lactic acidosis, hypoalbuminemia, and elevated total bilirubin.  Clinical Impression  Pt son Abigail Thomas and daughter Abigail Thomas present on entry. Son is pt caregiver, providing total care. They live in a handicapped equipped apartment with level entry. Pt was able to provide minimal assistance for pivot transfer bed<>wheelchair<>BSC<>shower seat. Son provides total A for iADLs and most ADLs. Pt is currently limited by baseline cognitive deficits, and profound weakness. PT recommends maximization of HH services to provide support for caregiver with activities like bathing and dressing as well as a hoyer lift. PT signing off. If pt becomes more interactive and is able to follow some commands, feel free to reorder PT.     If plan is discharge home, recommend the following: Two people to help with walking and/or transfers;Two people to help with bathing/dressing/bathroom;Assistance with cooking/housework;Assistance with feeding;Direct supervision/assist for medications management;Direct supervision/assist for financial management;Assist for transportation;Help with stairs or ramp for entrance;Supervision due to cognitive status   Can travel by private vehicle    no    Equipment Recommendations Hoyer lift  Recommendations for Other Services     Functional Status Assessment Patient has had a recent decline in their functional status and demonstrates the ability to make significant improvements in function in a reasonable and predictable amount of time.     Precautions / Restrictions  Precautions Precautions: Fall Restrictions Weight Bearing Restrictions Per Provider Order: No      Mobility  Bed Mobility Overal bed mobility: Needs Assistance Bed Mobility: Supine to Sit, Sit to Supine     Supine to sit: Total assist Sit to supine: Total assist   General bed mobility comments: pt provides no assit to come to EoB         Balance Overall balance assessment: Needs assistance Sitting-balance support: No upper extremity supported Sitting balance-Leahy Scale: Zero Sitting balance - Comments: requires total A for sitting on edge of bed                                     Pertinent Vitals/Pain Pain Assessment Pain Assessment: Faces Faces Pain Scale: Hurts a little bit Pain Location: generalized with movement Pain Descriptors / Indicators: Grimacing, Guarding Pain Intervention(s): Limited activity within patient's tolerance, Monitored during session, Repositioned    Home Living Family/patient expects to be discharged to:: Private residence Living Arrangements: Children Available Help at Discharge: Family;Available 24 hours/day Type of Home: Apartment Home Access: Level entry       Home Layout: One level Home Equipment: Wheelchair - manual;Hospital bed;BSC/3in1;Shower seat      Prior Function Prior Level of Function : Needs assist  Cognitive Assist : Mobility (cognitive);ADLs (cognitive)     Physical Assist : Mobility (physical);ADLs (physical)     Mobility Comments: pt was able to assist in transfer to and from wheelchair and shower seat, ADLs Comments: son Abigail Thomas assist     Extremity/Trunk Assessment        Lower Extremity Assessment Lower Extremity Assessment: Difficult to assess due to impaired cognition (does not walk at baseline able to transfer with  total assist of son from bed<>wheelchair<>BSC)    Cervical / Trunk Assessment Cervical / Trunk Assessment: Kyphotic  Communication   Communication Communication:  (No  voaclization throughout session)  Cognition Arousal: Lethargic Behavior During Therapy: Flat affect Overall Cognitive Status: History of cognitive impairments - at baseline                                 General Comments: pt opens her eyes with gentle sternal rub and her name, does not speak        General Comments General comments (skin integrity, edema, etc.): mouth extremly dry, recognizes son and daughter,        Assessment/Plan    PT Assessment Patient needs continued PT services  PT Problem List Decreased strength;Decreased activity tolerance;Decreased balance;Decreased mobility;Decreased cognition       PT Treatment Interventions DME instruction;Functional mobility training;Therapeutic activities;Therapeutic exercise;Balance training;Cognitive remediation;Patient/family education    PT Goals (Current goals can be found in the Care Plan section)  Acute Rehab PT Goals PT Goal Formulation: With patient Time For Goal Achievement: 07/13/23 Potential to Achieve Goals: Fair    Frequency Min 1X/week        AM-PAC PT "6 Clicks" Mobility  Outcome Measure Help needed turning from your back to your side while in a flat bed without using bedrails?: Total Help needed moving from lying on your back to sitting on the side of a flat bed without using bedrails?: Total Help needed moving to and from a bed to a chair (including a wheelchair)?: Total Help needed standing up from a chair using your arms (e.g., wheelchair or bedside chair)?: Total Help needed to walk in hospital room?: Total Help needed climbing 3-5 steps with a railing? : Total 6 Click Score: 6    End of Session   Activity Tolerance: Patient limited by lethargy Patient left: in bed;with call bell/phone within reach;with family/visitor present (Care team in room) Nurse Communication: Mobility status PT Visit Diagnosis: Muscle weakness (generalized) (M62.81);Adult, failure to thrive (R62.7)     Time: 0925-0938 PT Time Calculation (min) (ACUTE ONLY): 13 min   Charges:   PT Evaluation $PT Eval Moderate Complexity: 1 Mod   PT General Charges $$ ACUTE PT VISIT: 1 Visit         Abigail Thomas B. Beverely Risen PT, DPT Acute Rehabilitation Services Please use secure chat or  Call Office [phone]   Elon Alas [facility] 06/29/2023, 9:58 AM

## 2023-06-29 NOTE — Consult Note (Signed)
Consultation Note Date: 06/29/2023   Patient Name: Abigail Thomas  DOB: [birth year]  MRN: [id number]  Age / Sex: 78 y.o., female  PCP: Marletta Lor, NP Referring Physician: Gust Rung, DO  Reason for Consultation:  goals of care, advanced dementia, caregiver burnout, acute decline  HPI/Patient Profile: 78 y.o. female  with past medical history of dementia, cerebral palsy  admitted on 06/28/2023 with decreased mentation, no po intake for 4 days. She was being treated at home for UTI. Admitted and being treated for metabolic encephalopathy with hypernatremia, uremia, dehydration. Empiric treatment for UTI. Palliative medicine consulted for goals of care.   Primary Decision Maker NEXT OF KIN - son [name] and daughter [name]  Discussion: Chart reviewed including labs, progress notes, imaging from this and previous encounters.  On evaluation patient in bed with eyes closed. She did not respond to my voice or light touch. Appeared well nourished- per chart review she is nonambulatory at baseline (unclear if this is due to cerebral palsy or dementia) and she is able to feed herself. Dependent for all other iADLs and ADLs.  No family at bedside.  No advanced care planning documents on chart, however, she does have DNR code status.  Palliative was consulted in November and initial consult completed at that time with goals of care to "keep her around as long as possible" as long as she is not in any discomfort. Patient was referred for outpatient Palliative with [facility]. I called and left a message for patient's son. Daughter's voicemail was full.     SUMMARY OF RECOMMENDATIONS -Continue current plan of care -PMT will continue to follow and make attempts to meet with patient's family -Addendum- spoke with patient's son- meeting planned for tomorrow morning at 1030  Code Status/Advance Care  Planning: DNR   Prognosis:   Unable to determine  Discharge Planning: To Be Determined  Primary Diagnoses: Present on Admission:  Hypernatremia  Acute metabolic encephalopathy  Severe dementia (HCC)  Acute encephalopathy   Review of Systems  Unable to perform ROS: Mental status change    Physical Exam Vitals and nursing note reviewed.  Constitutional:      Appearance: She is normal weight. She is ill-appearing.  Cardiovascular:     Rate and Rhythm: Normal rate.  Pulmonary:     Effort: Pulmonary effort is normal.  Neurological:     Comments: Did not respond to questions     Vital Signs: BP (!) 119/91   Pulse 73   Temp (!) 97.4 F (36.3 C)   Resp 17   SpO2 100%  Pain Scale: Faces       SpO2: SpO2: 100 % O2 Device:SpO2: 100 % O2 Flow Rate: .   IO: Intake/output summary:  Intake/Output Summary (Last 24 hours) at 06/29/2023 1343 Last data filed at 06/29/2023 1029 Gross per 24 hour  Intake --  Output 250 ml  Net -250 ml    LBM:   Baseline Weight:   Most recent weight:  Thank you for this consult. Palliative medicine will continue to follow and assist as needed.  Time Total: 60 minutes Signed by: Ocie Bob, AGNP-C Palliative Medicine  Time includes:   Preparing to see the patient (e.g., review of tests) Obtaining and/or reviewing separately obtained history Performing a medically necessary appropriate examination and/or evaluation Counseling and educating the patient/family/caregiver Ordering medications, tests, or procedures Referring and communicating with other health care professionals (when not reported separately) Documenting clinical information in the electronic or other health record Independently interpreting results (not reported separately) and communicating results to the patient/family/caregiver Care coordination (not reported separately) Clinical documentation   Please contact Palliative Medicine Team phone at [phone]  for questions and concerns.  For individual provider: See Loretha Stapler

## 2023-06-30 DIAGNOSIS — R410 Disorientation, unspecified: Secondary | ICD-10-CM

## 2023-06-30 DIAGNOSIS — G309 Alzheimer's disease, unspecified: Secondary | ICD-10-CM | POA: Diagnosis not present

## 2023-06-30 DIAGNOSIS — E870 Hyperosmolality and hypernatremia: Secondary | ICD-10-CM | POA: Diagnosis not present

## 2023-06-30 DIAGNOSIS — F02C0 Dementia in other diseases classified elsewhere, severe, without behavioral disturbance, psychotic disturbance, mood disturbance, and anxiety: Secondary | ICD-10-CM | POA: Diagnosis not present

## 2023-06-30 DIAGNOSIS — Z515 Encounter for palliative care: Secondary | ICD-10-CM | POA: Diagnosis not present

## 2023-06-30 LAB — COMPREHENSIVE METABOLIC PANEL
ALT: 17 U/L (ref 0–44)
AST: 20 U/L (ref 15–41)
Albumin: 2.2 g/dL — ABNORMAL LOW (ref 3.5–5.0)
Alkaline Phosphatase: 88 U/L (ref 38–126)
Anion gap: 5 (ref 5–15)
BUN: 19 mg/dL (ref 8–23)
CO2: 23 mmol/L (ref 22–32)
Calcium: 7.7 mg/dL — ABNORMAL LOW (ref 8.9–10.3)
Chloride: 115 mmol/L — ABNORMAL HIGH (ref 98–111)
Creatinine, Ser: 0.7 mg/dL (ref 0.44–1.00)
GFR, Estimated: >60 mL/min (ref >60)
Glucose, Bld: 129 mg/dL — ABNORMAL HIGH (ref 70–99)
Potassium: 3.1 mmol/L — ABNORMAL LOW (ref 3.5–5.1)
Sodium: 143 mmol/L (ref 135–145)
Total Bilirubin: 1.4 mg/dL — ABNORMAL HIGH (ref 0.0–1.2)
Total Protein: 4.7 g/dL — ABNORMAL LOW (ref 6.5–8.1)

## 2023-06-30 MED ORDER — POTASSIUM CHLORIDE 10 MEQ/100ML IV SOLN
10.0000 meq | INTRAVENOUS | Status: AC
  Administered 2023-06-30 (×6): 10 meq via INTRAVENOUS
  Filled 2023-06-30 (×6): qty 100

## 2023-06-30 MED ORDER — BISACODYL 10 MG RE SUPP
10.0000 mg | Freq: Once | RECTAL | Status: AC
  Administered 2023-06-30: 10 mg via RECTAL
  Filled 2023-06-30: qty 1

## 2023-06-30 MED ORDER — DEXTROSE-SODIUM CHLORIDE 5-0.45 % IV SOLN: INTRAVENOUS | Status: DC

## 2023-06-30 NOTE — Plan of Care (Signed)

## 2023-06-30 NOTE — Progress Notes (Signed)
                 Subjective Continued somnolenc  Physical exam Blood pressure 125/88, pulse 62, temperature 98 F (36.7 C), temperature source Axillary, resp. rate 18, height 5\' 5"  (1.651 m), weight 63.5 kg, SpO2 98%.  Constitutional: somnolent, lying in bed, NAD Cardiovascular: regular rate and rhythm, no m/r/g, no LEE Pulmonary/Chest: normal work of breathing on room air, lungs clear to anterior auscultation bilaterally Abdominal: non-distended, soft, no apparent TTP Neurological: does not open eyes on command, withdraws to painful stimuli Skin: warm and dry, numerous ecchymoses on bilateral LE, most notably on left lateral malleolus  Weight change:    Intake/Output Summary (Last 24 hours) at 06/30/2023 1535 Last data filed at 06/30/2023 1500 Gross per 24 hour  Intake 2770.51 ml  Output 1100 ml  Net 1670.51 ml   Net IO Since Admission: 1,420.51 mL [06/30/23 1535]  Labs, images, and other studies    Latest Ref Rng & Units 06/29/2023    4:23 AM 06/28/2023    7:00 PM 06/28/2023   12:00 PM  CBC  WBC 4.0 - 10.5 K/uL 5.8  5.9  6.4   Hemoglobin 12.0 - 15.0 g/dL 16.1  09.6  04.5   Hematocrit 36.0 - 46.0 % 41.5  44.7  52.2   Platelets 150 - 400 K/uL 92  98  123        Latest Ref Rng & Units 06/30/2023    4:14 AM 06/29/2023    3:33 PM 06/29/2023    4:23 AM  BMP  Glucose 70 - 99 mg/dL 409  811  914   BUN 8 - 23 mg/dL 19  32  43   Creatinine 0.44 - 1.00 mg/dL 7.82  9.56  2.13   Sodium 135 - 145 mmol/L 143  148  155   Potassium 3.5 - 5.1 mmol/L 3.1  3.7  3.4   Chloride 98 - 111 mmol/L 115  120  126   CO2 22 - 32 mmol/L 23  23  20    Calcium 8.9 - 10.3 mg/dL 7.7  7.6  7.9      Assessment and plan Hospital day 1  Abigail Thomas is a 78 y.o.female with advanced dementia and cerebral palsy who presents to ED 1/27 via EMS with decreased mentation and admitted for acute metabolic encephalopathy with associated hypernatremia.   #Acute Metabolic Encephalopathy #Hx of Advanced  Dementia #Hypernatremia #Hypokalemia #Uremia #Hypoalbuminemia Afebrile. No leukocytosis. Mentation without much improvement from yesterday.  Na+ normalized at 143. K+ 3.1. Albumin 2.2. BC with NG after 2 days. Patient's family considering comfort care so will update palliative. - Start D5 1/2NS @ 75 cc/hr - IV K+ 10 mEq X 6 - Bladder scan q shift - Delirium precautions -Trend BMP  Diet: NPO IVF: D5 1/2NS @ 75 cc/hr VTE: enoxaparin (LOVENOX) injection 40 mg Start: 06/28/23 1700  Code: DNR   Carmina Miller, DO 06/30/2023, 3:35 PM  Pager: [phone] After 5pm or weekend: [phone]

## 2023-06-30 NOTE — Plan of Care (Signed)
Problem: Education: Goal: Knowledge of General Education information will improve Description: Including pain rating scale, medication(s)/side effects and non-pharmacologic comfort measures Outcome: Completed/Met

## 2023-06-30 NOTE — Progress Notes (Signed)
Speech Language Pathology Treatment: Dysphagia  Patient Details Name: HEATHERLY STENNER MRN: [id number] DOB: 1946-04-19 Today's Date: 06/30/2023 Time: 4098-1191 SLP Time Calculation (min) (ACUTE ONLY): 10 min  Assessment / Plan / Recommendation Clinical Impression  Patient seen by SLP for skilled treatment focused on dysphagia goals. RN was in room providing some oral care when SLP arrived. She reported that patient continues to be lethargic. She also stated that family plans to comes tomorrow to visit and work with medical team for GOC. Patient would periodically open her eyes and look around but overall appeared asleep. She was able verbally communicate in low volume voice to confirm "yeah" that she wanted "water". She accepted water into mouth but no attempt to transit it, resulting in anterior spillage. SLP inspected oral mucosa and was able to remove small amount of dried secretions adhered to her palate. SLP will plan to continue following patient for PO readiness pending likely GOC discussion with family and palliative care.   HPI HPI: Patient is an 78 y.o. female with PMH: advanced dementia, cerebral palsy who presented to the ED on 06/28/23 via EMS with decreased mentation. At baseline, patient is able to feed herself but not able to walk. Her mentation started worsening 6 days prior to admission and she was treated empirically for UTI by PCP. On Friday, 1/24, she stopped eating and drinking. She has been NPO since admission.      SLP Plan  Continue with current plan of care      Recommendations for follow up therapy are one component of a multi-disciplinary discharge planning process, led by the attending physician.  Recommendations may be updated based on patient status, additional functional criteria and insurance authorization.    Recommendations  Diet recommendations: NPO Medication Administration: Via alternative means                  Oral care QID;Staff/trained caregiver  to provide oral care   Frequent or constant Supervision/Assistance Dysphagia, unspecified (R13.10)     Continue with current plan of care     Angela Nevin, MA, CCC-SLP Speech Therapy

## 2023-06-30 NOTE — Progress Notes (Addendum)
Daily Progress Note   Patient Name: Abigail Thomas       Date: 06/30/2023 DOB: [birth year]  Age: 78 y.o. MRN#: [id number] Attending Physician: Gust Rung, DO Primary Care Physician: Marletta Lor, NP Admit Date: 06/28/2023  Reason for Consultation/Follow-up: Establishing goals of care  Patient Profile/HPI:  78 y.o. female  with past medical history of dementia, cerebral palsy  admitted on 06/28/2023 with decreased mentation, no po intake for 4 days. She was being treated at home for UTI. Admitted and being treated for metabolic encephalopathy with hypernatremia, uremia, dehydration. Empiric treatment for UTI. Palliative medicine consulted for goals of care.   Subjective: Chart reviewed including labs, progress notes, imaging from this and previous encounters.  Sodium improved today- potassium low. SLP has recommended NPO status.  Patient somnolent, grimacing at times. Does not open eyes to my voice or touch.  Met with patient's son, Abigail Thomas and daughter Abigail Thomas. Dr. Neal Dy, MD resident also present.  Prior to admission patient living with Abigail Thomas- requires assistance for all ADL's except she does feed herself. Abigail Thomas is primary caregiver. Nonambulatory. She has not had a BM since last Friday- she has history of constipation. Patient used to enjoy sitting on rocking chair on front porch, crocheting.  Reviewed Chrysta's current acute and chronic illness. Discussed trajectory of dementia and how acute insults can advance dementia. Options for continued life prolonging care, IV fluids, rehospitalizations vs transition to comfort and hospice discussed. Hospice philosophy and services reviewed.   Goals of care were discussed in the context of quality of life.  At this point- goals of care are to keep  patient "alive". Abigail Thomas defines "alive" as breathing and having a heart beat. He had difficulty addressing quality of life questions.  We discussed scope of interventions- family requests limits set at no intubation and no feeding tube.  I reviewed concern that patient's mental status may not return to point she is able to eat and drink. Family agreed that losing the ability to eat and drink would be their sign that it was time to transition to comfort measures.  Emotional support provided.     Review of Systems  Unable to perform ROS: Mental status change     Physical Exam Vitals and nursing note reviewed.  Constitutional:      Appearance: She is  ill-appearing.  Cardiovascular:     Rate and Rhythm: Normal rate.  Pulmonary:     Effort: Pulmonary effort is normal.  Neurological:     Comments: nonverbal             Vital Signs: BP (!) 127/106 (BP Location: Right Arm)   Pulse 68   Temp 98.7 F (37.1 C) (Axillary)   Resp 20   Ht 5\' 5"  (1.651 m)   Wt 63.5 kg   SpO2 99%   BMI 23.30 kg/m  SpO2: SpO2: 99 % O2 Device: O2 Device: Room Air O2 Flow Rate:    Intake/output summary:  Intake/Output Summary (Last 24 hours) at 06/30/2023 1209 Last data filed at 06/30/2023 7829 Gross per 24 hour  Intake 2770.51 ml  Output 800 ml  Net 1970.51 ml   LBM: Last BM Date :  (PTA, unknown) Baseline Weight: Weight: 63.5 kg Most recent weight: Weight: 63.5 kg       Palliative Assessment/Data: PPS:  10%      Patient Active Problem List   Diagnosis Date Noted   Hyponatremia 06/29/2023   Delirium 06/29/2023   Hypernatremia 06/28/2023   Acute metabolic encephalopathy 06/28/2023   Acute encephalopathy 06/28/2023   UTI (urinary tract infection) 04/11/2023   Severe dementia (HCC) 04/10/2023   Pressure injury of skin 04/10/2023   Witnessed seizure-like activity (HCC) 04/09/2023   Knee pain, acute    Pyelonephritis 03/17/2015   Infestation by bed bug 03/16/2015   Headache 03/16/2015    Knee pain, bilateral 03/15/2015   Fall    Urinary tract infectious disease    Weakness     Palliative Care Assessment & Plan    Assessment/Recommendations/Plan  Continue current plan- family is hopeful patient will return to being able to eat and drink- would not want feeding tube Limits set at DNR, no feeding tube, if patient's mental status does not improve to point of being able to eat and drink on her own- then agree to comfort measures If patient recovers this hospitalization then family is open to referral to GUIDE program through [facility] Constipation- has not had a BM since 1/24- ducolax suppository x 1 as she is not taking po   Code Status: DNR  Prognosis:  Unable to determine  Discharge Planning: To Be Determined  Care plan was discussed with family  Thank you for allowing the Palliative Medicine Team to assist in the care of this patient.  Total time:  90 minutes Prolonged billing:  Time includes:   Preparing to see the patient (e.g., review of tests) Obtaining and/or reviewing separately obtained history Performing a medically necessary appropriate examination and/or evaluation Counseling and educating the patient/family/caregiver Ordering medications, tests, or procedures Referring and communicating with other health care professionals (when not reported separately) Documenting clinical information in the electronic or other health record Independently interpreting results (not reported separately) and communicating results to the patient/family/caregiver Care coordination (not reported separately) Clinical documentation  Ocie Bob, AGNP-C Palliative Medicine   Please contact Palliative Medicine Team phone at [phone] for questions and concerns.

## 2023-07-01 DIAGNOSIS — G9340 Encephalopathy, unspecified: Secondary | ICD-10-CM | POA: Diagnosis not present

## 2023-07-01 DIAGNOSIS — F02C0 Dementia in other diseases classified elsewhere, severe, without behavioral disturbance, psychotic disturbance, mood disturbance, and anxiety: Secondary | ICD-10-CM | POA: Diagnosis not present

## 2023-07-01 DIAGNOSIS — G9341 Metabolic encephalopathy: Secondary | ICD-10-CM | POA: Diagnosis not present

## 2023-07-01 DIAGNOSIS — Z515 Encounter for palliative care: Secondary | ICD-10-CM | POA: Diagnosis not present

## 2023-07-01 DIAGNOSIS — G309 Alzheimer's disease, unspecified: Secondary | ICD-10-CM | POA: Diagnosis not present

## 2023-07-01 DIAGNOSIS — E870 Hyperosmolality and hypernatremia: Secondary | ICD-10-CM | POA: Diagnosis not present

## 2023-07-01 DIAGNOSIS — K5900 Constipation, unspecified: Secondary | ICD-10-CM

## 2023-07-01 DIAGNOSIS — E871 Hypo-osmolality and hyponatremia: Secondary | ICD-10-CM

## 2023-07-01 LAB — BASIC METABOLIC PANEL
Anion gap: 7 (ref 5–15)
BUN: 15 mg/dL (ref 8–23)
CO2: 21 mmol/L — ABNORMAL LOW (ref 22–32)
Calcium: 7.8 mg/dL — ABNORMAL LOW (ref 8.9–10.3)
Chloride: 112 mmol/L — ABNORMAL HIGH (ref 98–111)
Creatinine, Ser: 0.89 mg/dL (ref 0.44–1.00)
GFR, Estimated: >60 mL/min (ref >60)
Glucose, Bld: 129 mg/dL — ABNORMAL HIGH (ref 70–99)
Potassium: 3.5 mmol/L (ref 3.5–5.1)
Sodium: 140 mmol/L (ref 135–145)

## 2023-07-01 MED ORDER — DEXTROSE-SODIUM CHLORIDE 5-0.45 % IV SOLN: INTRAVENOUS | Status: DC

## 2023-07-01 NOTE — Plan of Care (Signed)
Problem: Clinical Measurements: Goal: Diagnostic test results will improve Outcome: Adequate for Discharge

## 2023-07-01 NOTE — Progress Notes (Signed)
Speech Language Pathology Treatment: Dysphagia  Patient Details Name: Abigail Thomas MRN: [id number] DOB: 1946/05/29 Today's Date: 07/01/2023 Time: 1000-1010 SLP Time Calculation (min) (ACUTE ONLY): 10 min  Assessment / Plan / Recommendation Clinical Impression  Patient seen by SLP for skilled session focused on dysphagia and specifically focused on education and discussion with patient's son. Patient herself continues to be lethargic but will open her eyes periodically. She does not interact with others in room. She was very resistant to oral care with toothette sponge soaked in water, closing lips tightly. SLP was able to remove small amount of secretions from oral cavity before she started completely refusing.  SLP spoke with son regarding her baseline and he reported that she was able to eat solid foods and drink liquids prior to current admission. He stated that patient was only having bowel movements every 3-4 days and on the fourth day, he would give her a suppository which did help some. SLP explained that main barrier at this time is her lethargy and inability to maintain alertness for safe PO intake. Attending MD and residents then arrived into room to discuss POC with patient's son. SLP will continue to follow for PO readiness with prognosis guarded to fair.   HPI HPI: Patient is an 78 y.o. female with PMH: advanced dementia, cerebral palsy who presented to the ED on 06/28/23 via EMS with decreased mentation. At baseline, patient is able to feed herself but not able to walk. Her mentation started worsening 6 days prior to admission and she was treated empirically for UTI by PCP. On Friday, 1/24, she stopped eating and drinking. She has been NPO since admission.      SLP Plan  Continue with current plan of care      Recommendations for follow up therapy are one component of a multi-disciplinary discharge planning process, led by the attending physician.  Recommendations may be updated  based on patient status, additional functional criteria and insurance authorization.    Recommendations  Diet recommendations: NPO Medication Administration: Via alternative means                  Oral care QID;Staff/trained caregiver to provide oral care   Frequent or constant Supervision/Assistance Dysphagia, unspecified (R13.10)     Continue with current plan of care     Angela Nevin, MA, CCC-SLP Speech Therapy

## 2023-07-01 NOTE — Plan of Care (Signed)

## 2023-07-01 NOTE — Progress Notes (Signed)
Subjective Continued somnolence but slightly more alert. Briefly opens eyes but does not respond.  Physical exam Blood pressure 110/89, pulse 75, temperature 99.5 F (37.5 C), temperature source Axillary, resp. rate 18, height 5\' 5"  (1.651 m), weight 63.5 kg, SpO2 98%.  Constitutional: somnolent, lying in bed, NAD Cardiovascular: regular rate and rhythm, no m/r/g, no LEE Pulmonary/Chest: normal work of breathing on room air, lungs clear to anterior auscultation bilaterally Abdominal: non-distended, soft, no apparent TTP Neurological: briefly opens eyes but not on command, withdraws to painful stimuli Skin: warm and dry, numerous ecchymoses on bilateral LE, most notably on left lateral malleolus  Weight change:    Intake/Output Summary (Last 24 hours) at 07/01/2023 1316 Last data filed at 07/01/2023 0900 Gross per 24 hour  Intake 376.3 ml  Output 300 ml  Net 76.3 ml   Net IO Since Admission: 1,796.81 mL [07/01/23 1316]  Labs, images, and other studies    Latest Ref Rng & Units 06/29/2023    4:23 AM 06/28/2023    7:00 PM 06/28/2023   12:00 PM  CBC  WBC 4.0 - 10.5 K/uL 5.8  5.9  6.4   Hemoglobin 12.0 - 15.0 g/dL 16.1  09.6  04.5   Hematocrit 36.0 - 46.0 % 41.5  44.7  52.2   Platelets 150 - 400 K/uL 92  98  123        Latest Ref Rng & Units 07/01/2023    4:35 AM 06/30/2023    4:14 AM 06/29/2023    3:33 PM  BMP  Glucose 70 - 99 mg/dL 409  811  914   BUN 8 - 23 mg/dL 15  19  32   Creatinine 0.44 - 1.00 mg/dL 7.82  9.56  2.13   Sodium 135 - 145 mmol/L 140  143  148   Potassium 3.5 - 5.1 mmol/L 3.5  3.1  3.7   Chloride 98 - 111 mmol/L 112  115  120   CO2 22 - 32 mmol/L 21  23  23    Calcium 8.9 - 10.3 mg/dL 7.8  7.7  7.6      Assessment and plan Hospital day 2  Abigail Thomas is a 78 y.o. female with advanced dementia and cerebral palsy who presents to ED 1/27 via EMS with decreased mentation and admitted for acute metabolic encephalopathy with associated  hypernatremia.   #Acute Metabolic Encephalopathy #Hx of Advanced Dementia #Hypernatremia #Hypokalemia Mentation has somewhat improved but patient with continued somnolence and only briefly opens eyes. Not on command. Na+ normalized at 140 and. K+ 3.5. Unfortunately patient still not safe for diet with SLP continuing to recommend NPO. Palliative following and help is much appreciated. Both palliative and primary team had extensive discussion with family today regarding overall prognosis now that electrolytes have been normalized. Son, Abigail Thomas, is agreeable with home hospice but is certainly hopeful she gets back to baseline mental status. We will continue fluids per below in hopes that patient begins to eat/drink on her own, but without significant improvement, anticipate discharge with home hospice in coming days. Referral has been placed. - Continue D5 1/2NS @ 75 cc/hr - Bladder scan q shift - Delirium precautions -Trend BMP   Diet: NPO IVF: D5 1/2NS @ 75 cc/hr VTE: enoxaparin (LOVENOX) injection 40 mg Start: 06/28/23 1700  Code: DNR  Discharge plan: [facility]    [place], DO 07/01/2023, 1:16 PM  Pager:  [phone] After 5pm or weekend: [phone]

## 2023-07-01 NOTE — Progress Notes (Signed)
Daily Progress Note   Patient Name: Abigail Thomas       Date: 07/01/2023 DOB: [birth year]  Age: 78 y.o. MRN#: [id number] Attending Physician: Gust Rung, DO Primary Care Physician: Marletta Lor, NP Admit Date: 06/28/2023  Reason for Consultation/Follow-up: Establishing goals of care  Patient Profile/HPI:  78 y.o. female  with past medical history of dementia, cerebral palsy  admitted on 06/28/2023 with decreased mentation, no po intake for 4 days. She was being treated at home for UTI. Admitted and being treated for metabolic encephalopathy with hypernatremia, uremia, dehydration. Empiric treatment for UTI. Palliative medicine consulted for goals of care.   Subjective: Chart reviewed including labs, progress notes, imaging from this and previous encounters.  No family at bedside. Patient did not wake to my voice.  Spoke on phone with patient's son. He notes that he believes patient is improving. His goals are to maximize hospital stay and then discharge home with hospice.    Review of Systems  Unable to perform ROS: Mental status change     Physical Exam Vitals and nursing note reviewed.  Constitutional:      Appearance: She is ill-appearing.  Cardiovascular:     Rate and Rhythm: Normal rate.  Pulmonary:     Effort: Pulmonary effort is normal.  Neurological:     Comments: nonverbal             Vital Signs: BP 110/89 (BP Location: Right Arm)   Pulse 75   Temp 99.5 F (37.5 C) (Axillary)   Resp 18   Ht 5\' 5"  (1.651 m)   Wt 63.5 kg   SpO2 98%   BMI 23.30 kg/m  SpO2: SpO2: 98 % O2 Device: O2 Device: Room Air O2 Flow Rate:    Intake/output summary:  Intake/Output Summary (Last 24 hours) at 07/01/2023 1319 Last data filed at 07/01/2023 0900 Gross per 24 hour  Intake  376.3 ml  Output 300 ml  Net 76.3 ml   LBM: Last BM Date : 06/30/23 Baseline Weight: Weight: 63.5 kg Most recent weight: Weight: 63.5 kg       Palliative Assessment/Data: PPS:  10%      Patient Active Problem List   Diagnosis Date Noted   Hyponatremia 06/29/2023   Delirium 06/29/2023   Hypernatremia 06/28/2023   Acute metabolic encephalopathy 06/28/2023  Acute encephalopathy 06/28/2023   UTI (urinary tract infection) 04/11/2023   Severe dementia (HCC) 04/10/2023   Pressure injury of skin 04/10/2023   Witnessed seizure-like activity (HCC) 04/09/2023   Knee pain, acute    Pyelonephritis 03/17/2015   Infestation by bed bug 03/16/2015   Headache 03/16/2015   Knee pain, bilateral 03/15/2015   Fall    Urinary tract infectious disease    Weakness     Palliative Care Assessment & Plan    Assessment/Recommendations/Plan  Continue current plan- family is hopeful patient will return to being able to eat and drink- would not want feeding tube Constipation- has not had a BM since 1/24- ducolax suppository x 1 as she is not taking po- resolved- pt with BM on 1/29 Order placed for hospice services at home   Code Status: DNR  Prognosis:  Unable to determine  Discharge Planning: To Be Determined  Care plan was discussed with family  Thank you for allowing the Palliative Medicine Team to assist in the care of this patient.  Total time:  60 minutes Prolonged billing:  Time includes:   Preparing to see the patient (e.g., review of tests) Obtaining and/or reviewing separately obtained history Performing a medically necessary appropriate examination and/or evaluation Counseling and educating the patient/family/caregiver Ordering medications, tests, or procedures Referring and communicating with other health care professionals (when not reported separately) Documenting clinical information in the electronic or other health record Independently interpreting results (not  reported separately) and communicating results to the patient/family/caregiver Care coordination (not reported separately) Clinical documentation  Ocie Bob, AGNP-C Palliative Medicine   Please contact Palliative Medicine Team phone at [phone] for questions and concerns.

## 2023-07-02 DIAGNOSIS — Z515 Encounter for palliative care: Secondary | ICD-10-CM | POA: Diagnosis not present

## 2023-07-02 DIAGNOSIS — F02C0 Dementia in other diseases classified elsewhere, severe, without behavioral disturbance, psychotic disturbance, mood disturbance, and anxiety: Secondary | ICD-10-CM | POA: Diagnosis not present

## 2023-07-02 DIAGNOSIS — G309 Alzheimer's disease, unspecified: Secondary | ICD-10-CM | POA: Diagnosis not present

## 2023-07-02 DIAGNOSIS — R410 Disorientation, unspecified: Secondary | ICD-10-CM | POA: Diagnosis not present

## 2023-07-02 LAB — BASIC METABOLIC PANEL
Anion gap: 8 (ref 5–15)
BUN: 8 mg/dL (ref 8–23)
CO2: 21 mmol/L — ABNORMAL LOW (ref 22–32)
Calcium: 7.5 mg/dL — ABNORMAL LOW (ref 8.9–10.3)
Chloride: 110 mmol/L (ref 98–111)
Creatinine, Ser: 0.58 mg/dL (ref 0.44–1.00)
GFR, Estimated: >60 mL/min (ref >60)
Glucose, Bld: 108 mg/dL — ABNORMAL HIGH (ref 70–99)
Potassium: 2.9 mmol/L — ABNORMAL LOW (ref 3.5–5.1)
Sodium: 139 mmol/L (ref 135–145)

## 2023-07-02 MED ORDER — POTASSIUM CHLORIDE 10 MEQ/100ML IV SOLN
10.0000 meq | INTRAVENOUS | Status: AC
Start: 2023-07-02 — End: 2023-07-02
  Administered 2023-07-02 (×6): 10 meq via INTRAVENOUS
  Filled 2023-07-02 (×6): qty 100

## 2023-07-02 MED ORDER — DEXTROSE-SODIUM CHLORIDE 5-0.45 % IV SOLN: INTRAVENOUS | Status: AC

## 2023-07-02 MED ORDER — POTASSIUM CHLORIDE 20 MEQ PO PACK
40.0000 meq | PACK | Freq: Two times a day (BID) | ORAL | Status: DC
  Administered 2023-07-02 – 2023-07-04 (×4): 40 meq via ORAL
  Filled 2023-07-02 (×3): qty 2

## 2023-07-02 NOTE — TOC CM/SW Note (Addendum)
Transition of Care [facility]) - Inpatient Brief Assessment   Patient Details  Name: Abigail Thomas MRN: [id number] Date of Birth: Mar 31, 1946  Transition of Care [facility]) CM/SW Contact:    Tom-Johnson, Hershal Coria, RN Phone Number: 07/02/2023, 2:52 PM   Clinical Narrative:  CM consulted for home with hospice. CM spoke with patient's son, [name] and explained to him about Hospice services with understanding verbalized. CM gave [name] a list of Hospice agencies from [employer]gov list and he chose [facility].  CM notified care team and Glenna Fellows with [facility] via Secure chat. PTAR will be scheduled at discharge.  CM will continue to follow.          Transition of Care Asessment: Insurance and Status: Insurance coverage has been reviewed Patient has primary care physician: Yes Home environment has been reviewed: Yes Prior level of function:: Dependent Prior/Current Home Services: No current home services Social Drivers of Health Review: SDOH reviewed no interventions necessary Readmission risk has been reviewed: Yes Transition of care needs: transition of care needs identified, TOC will continue to follow

## 2023-07-02 NOTE — Care Management Important Message (Signed)
Important Message  Patient Details  Name: Abigail Thomas MRN: [id number] Date of Birth: [birth year]   Important Message Given:  Yes - Medicare IM     Dorena Bodo 07/02/2023, 2:45 PM

## 2023-07-02 NOTE — Progress Notes (Signed)
Speech Language Pathology Treatment: Dysphagia  Patient Details Name: Abigail Thomas MRN: [id number] DOB: 1945/06/12 Today's Date: 07/02/2023 Time: 6213-0865 SLP Time Calculation (min) (ACUTE ONLY): 13 min  Assessment / Plan / Recommendation Clinical Impression  Pt's level of alertness improved this morning and able to keep eyes open and participate in po trials. Oral care provided which she was not as resistant to. Able to accept small bites of applesauce, transit to posterior oral cavity fairly timely. Thin liquids administered by spoon, cup and able to drink small sip from straw with suspected minimal delayed swallow initiation. There were no indications of decreased airway protection per this subjective measure. Able to clear her oral cavity following applesauce. SLP initiated puree, thin liquids, crush meds and ensure she is adequately awake for po's. Discussed with RN, MD's and called son to update him. Education provided re: pureeing food at home and other foods she can have (applesauce, yogurt, pudding etc). Pt may have periods where she is not alert or cognitively able to take po's which is to be expected. ST will follow up once more if she is still in the hospital however ok for her to discharge prior to another ST session as goal is home with Hospice.    HPI HPI: Patient is an 78 y.o. female with PMH: advanced dementia, cerebral palsy who presented to the ED on 06/28/23 via EMS with decreased mentation. At baseline, patient is able to feed herself but not able to walk. Her mentation started worsening 6 days prior to admission and she was treated empirically for UTI by PCP. On Friday, 1/24, she stopped eating and drinking. She has been NPO since admission.      SLP Plan  Continue with current plan of care      Recommendations for follow up therapy are one component of a multi-disciplinary discharge planning process, led by the attending physician.  Recommendations may be updated based on  patient status, additional functional criteria and insurance authorization.    Recommendations  Diet recommendations: Dysphagia 1 (puree);Thin liquid Liquids provided via: Cup;Straw;Teaspoon Medication Administration: Crushed with puree Supervision: Full supervision/cueing for compensatory strategies;Staff to assist with self feeding Compensations: Slow rate;Small sips/bites;Minimize environmental distractions Postural Changes and/or Swallow Maneuvers: Seated upright 90 degrees                  Oral care BID   Frequent or constant Supervision/Assistance Dysphagia, unspecified (R13.10)     Continue with current plan of care     Royce Macadamia  07/02/2023, 10:22 AM

## 2023-07-02 NOTE — Progress Notes (Addendum)
                 Subjective More alert today with eyes open most of encounter. Incoherent speech.   Physical exam Blood pressure 127/73, pulse 67, temperature 98 F (36.7 C), temperature source Oral, resp. rate 18, height 5\' 5"  (1.651 m), weight 63.5 kg, SpO2 100%.  Constitutional: somnolent, lying in bed, NAD Cardiovascular: regular rate and rhythm, no m/r/g, no LEE Pulmonary/Chest: normal work of breathing on room air, lungs clear to anterior auscultation bilaterally Abdominal: non-distended, soft, no apparent TTP Neurological: opens eyes but not on command, withdraws to painful stimuli Skin: warm and dry  Weight change:    Intake/Output Summary (Last 24 hours) at 07/02/2023 1023 Last data filed at 07/02/2023 0600 Gross per 24 hour  Intake 1782.08 ml  Output 2200 ml  Net -417.92 ml   Net IO Since Admission: 1,378.89 mL [07/02/23 1023]  Labs, images, and other studies    Latest Ref Rng & Units 06/29/2023    4:23 AM 06/28/2023    7:00 PM 06/28/2023   12:00 PM  CBC  WBC 4.0 - 10.5 K/uL 5.8  5.9  6.4   Hemoglobin 12.0 - 15.0 g/dL 95.6  21.3  08.6   Hematocrit 36.0 - 46.0 % 41.5  44.7  52.2   Platelets 150 - 400 K/uL 92  98  123        Latest Ref Rng & Units 07/02/2023    5:56 AM 07/01/2023    4:35 AM 06/30/2023    4:14 AM  BMP  Glucose 70 - 99 mg/dL 578  469  629   BUN 8 - 23 mg/dL 8  15  19    Creatinine 0.44 - 1.00 mg/dL 5.28  4.13  2.44   Sodium 135 - 145 mmol/L 139  140  143   Potassium 3.5 - 5.1 mmol/L 2.9  3.5  3.1   Chloride 98 - 111 mmol/L 110  112  115   CO2 22 - 32 mmol/L 21  21  23    Calcium 8.9 - 10.3 mg/dL 7.5  7.8  7.7      Assessment and plan Hospital day 3  Abigail Thomas is a 78 y.o.female with advanced dementia and cerebral palsy who presents to ED 1/27 via EMS with decreased mentation and admitted for acute metabolic encephalopathy with associated hypernatremia.    #Acute Metabolic Encephalopathy #Hx of Advanced  Dementia #Hypernatremia #Hypokalemia Mentation has somewhat improved as patient is opening eyes more interactively. SLP re-evaluated today and advanced from NPO to Dysphagia 1. Na+ 39 and. K+ 2.9. Palliative following and help is much appreciated. Both palliative and primary team had extensive discussion with family regarding overall prognosis. Son, Abigail Thomas, is agreeable with home hospice but is certainly hopeful she gets back to baseline mental status. We will continue fluids per below in addition to diet in hopes to further improve mentation. Referral has been placed for HH. - Continue D5 1/2NS @ 75 cc/hr - Kcl 10 mEq X 6 -Trend BMP - Delirium precautions    Diet: Dysphagia I IVF: D5 1/2NS @ 75 cc/hr VTE: enoxaparin (LOVENOX) injection 40 mg Start: 06/28/23 1700  Code: DNR   Discharge plan: [facility]    [place], DO 07/02/2023, 10:23 AM  Pager: [phone] After 5pm or weekend: [phone]

## 2023-07-02 NOTE — Progress Notes (Signed)
Daily Progress Note   Patient Name: Abigail Thomas       Date: 07/02/2023 DOB: 1945-06-14  Age: 78 y.o. MRN#: [id number] Attending Physician: Gust Rung, DO Primary Care Physician: Marletta Lor, NP Admit Date: 06/28/2023  Reason for Consultation/Follow-up: Establishing goals of care  Patient Profile/HPI:  78 y.o. female  with past medical history of dementia, cerebral palsy  admitted on 06/28/2023 with decreased mentation, no po intake for 4 days. She was being treated at home for UTI. Admitted and being treated for metabolic encephalopathy with hypernatremia, uremia, dehydration. Empiric treatment for UTI. Palliative medicine consulted for goals of care.   Subjective: Chart reviewed including labs, progress notes, imaging from this and previous encounters.  No family at bedside. Patient woke briefly, did not make eye contact- did not respond to questions.  Spoke on phone with patient's son. He notes that he believes patient is improving. His goals are to maximize hospital stay and then discharge home with hospice.    Review of Systems  Unable to perform ROS: Mental status change     Physical Exam Vitals and nursing note reviewed.  Constitutional:      Appearance: She is ill-appearing.  Cardiovascular:     Rate and Rhythm: Normal rate.  Pulmonary:     Effort: Pulmonary effort is normal.  Neurological:     Comments: nonverbal             Vital Signs: BP 127/73 (BP Location: Right Arm)   Pulse 67   Temp 98 F (36.7 C) (Oral)   Resp 18   Ht 5\' 5"  (1.651 m)   Wt 63.5 kg   SpO2 100%   BMI 23.30 kg/m  SpO2: SpO2: 100 % O2 Device: O2 Device: Room Air O2 Flow Rate:    Intake/output summary:  Intake/Output Summary (Last 24 hours) at 07/02/2023 1058 Last data filed at  07/02/2023 0600 Gross per 24 hour  Intake 1782.08 ml  Output 2200 ml  Net -417.92 ml   LBM: Last BM Date : 07/01/23 Baseline Weight: Weight: 63.5 kg Most recent weight: Weight: 63.5 kg       Palliative Assessment/Data: PPS:  10%      Patient Active Problem List   Diagnosis Date Noted   Hyponatremia 06/29/2023   Delirium 06/29/2023   Hypernatremia 06/28/2023  Acute metabolic encephalopathy 06/28/2023   Acute encephalopathy 06/28/2023   UTI (urinary tract infection) 04/11/2023   Severe dementia (HCC) 04/10/2023   Pressure injury of skin 04/10/2023   Witnessed seizure-like activity (HCC) 04/09/2023   Knee pain, acute    Pyelonephritis 03/17/2015   Infestation by bed bug 03/16/2015   Headache 03/16/2015   Knee pain, bilateral 03/15/2015   Fall    Urinary tract infectious disease    Weakness     Palliative Care Assessment & Plan    Assessment/Recommendations/Plan  Continue current plan- family is hopeful patient will return to being able to eat and drink- would not want feeding tube Constipation- has not had a BM since 1/24- ducolax suppository x 1 as she is not taking po- resolved- pt with BM on 1/29 Order placed for hospice services at home   Code Status: DNR  Prognosis:  Unable to determine  Discharge Planning: To Be Determined  Care plan was discussed with family  Thank you for allowing the Palliative Medicine Team to assist in the care of this patient.  Total time:  35 minutes Prolonged billing:  Time includes:   Preparing to see the patient (e.g., review of tests) Obtaining and/or reviewing separately obtained history Performing a medically necessary appropriate examination and/or evaluation Counseling and educating the patient/family/caregiver Ordering medications, tests, or procedures Referring and communicating with other health care professionals (when not reported separately) Documenting clinical information in the electronic or other  health record Independently interpreting results (not reported separately) and communicating results to the patient/family/caregiver Care coordination (not reported separately) Clinical documentation  Ocie Bob, AGNP-C Palliative Medicine   Please contact Palliative Medicine Team phone at [phone] for questions and concerns.

## 2023-07-02 NOTE — Plan of Care (Signed)
  Problem: Clinical Measurements: Goal: Diagnostic test results will improve Outcome: Completed/Met Goal: Respiratory complications will improve Outcome: Completed/Met Goal: Cardiovascular complication will be avoided Outcome: Completed/Met   

## 2023-07-02 NOTE — Discharge Summary (Incomplete)
Name: Abigail Thomas MRN: [id number] DOB: 08/09/1945 78 y.o. PCP: Marletta Lor, NP  Date of Admission: 06/28/2023 11:39 AM Date of Discharge:  07/02/2023 Attending Physician: Dr.  Johny Sax  DISCHARGE DIAGNOSIS:  Primary Problem: Acute metabolic encephalopathy   Hospital Problems: Principal Problem:   Acute metabolic encephalopathy Active Problems:   Severe dementia (HCC)   Pressure injury of skin   Hypernatremia   Acute encephalopathy   Hyponatremia   Delirium    DISCHARGE MEDICATIONS:   Allergies as of 07/02/2023   No Known Allergies   Med Rec must be completed prior to using this SMARTLINK***       DISPOSITION AND FOLLOW-UP:  Ms.Darnette D Dishon was discharged from [facility] in Stable condition. At the hospital follow up visit please address:  Follow-up Recommendations: Consults: {misc; consult options:15850} Labs: {Lab list:16995} Studies: *** Medications: ***  Follow-up Appointments:   HOSPITAL COURSE:  Patient Summary:    DISCHARGE INSTRUCTIONS:    SUBJECTIVE:  *** Discharge Vitals:   BP (!) 92/45 (BP Location: Left Leg)   Pulse 67   Temp (!) 97.4 F (36.3 C) (Oral)   Resp 20   Ht 5\' 5"  (1.651 m)   Wt 63.5 kg   SpO2 (!) 71%   BMI 23.30 kg/m   OBJECTIVE:  Physical Exam   Pertinent Labs, Studies, and Procedures:     Latest Ref Rng & Units 06/29/2023    4:23 AM 06/28/2023    7:00 PM 06/28/2023   12:00 PM  CBC  WBC 4.0 - 10.5 K/uL 5.8  5.9  6.4   Hemoglobin 12.0 - 15.0 g/dL 40.9  81.1  91.4   Hematocrit 36.0 - 46.0 % 41.5  44.7  52.2   Platelets 150 - 400 K/uL 92  98  123        Latest Ref Rng & Units 07/02/2023    5:56 AM 07/01/2023    4:35 AM 06/30/2023    4:14 AM  CMP  Glucose 70 - 99 mg/dL 782  956  213   BUN 8 - 23 mg/dL 8  15  19    Creatinine 0.44 - 1.00 mg/dL 0.86  5.78  4.69   Sodium 135 - 145 mmol/L 139  140  143   Potassium 3.5 - 5.1 mmol/L 2.9  3.5  3.1   Chloride 98 - 111 mmol/L 110  112   115   CO2 22 - 32 mmol/L 21  21  23    Calcium 8.9 - 10.3 mg/dL 7.5  7.8  7.7   Total Protein 6.5 - 8.1 g/dL   4.7   Total Bilirubin 0.0 - 1.2 mg/dL   1.4   Alkaline Phos 38 - 126 U/L   88   AST 15 - 41 U/L   20   ALT 0 - 44 U/L   17     DG Ankle Complete Left Result Date: 06/28/2023 CLINICAL DATA:  Pain and swelling, initial encounter EXAM: LEFT ANKLE COMPLETE - 3+ VIEW COMPARISON:  None Available. FINDINGS: Lateral soft tissue swelling is noted. No underlying fracture or dislocation is noted. Vascular calcifications are seen. IMPRESSION: Soft tissue swelling laterally without acute bony abnormality. Electronically Signed   By: Alcide Clever M.D.   On: 06/28/2023 21:21   CT Head Wo Contrast Result Date: 06/28/2023 CLINICAL DATA:  Mental status change, unknown cause. EXAM: CT HEAD WITHOUT CONTRAST TECHNIQUE: Contiguous axial images were obtained from the base of the skull through the vertex  without intravenous contrast. RADIATION DOSE REDUCTION: This exam was performed according to the departmental dose-optimization program which includes automated exposure control, adjustment of the mA and/or kV according to patient size and/or use of iterative reconstruction technique. COMPARISON:  Head CT 04/09/2023 FINDINGS: The study is moderately motion degraded. Brain: There is no evidence of an acute infarct, intracranial hemorrhage, mass, midline shift, or extra-axial fluid collection. There is moderate cerebral atrophy. Patchy cerebral white matter hypodensities are nonspecific but compatible with mild-to-moderate chronic small vessel ischemic disease. Vascular: No hyperdense vessel. Skull: No acute fracture or suspicious osseous lesion. Sinuses/Orbits: The visualized paranasal sinuses and mastoid air cells are clear. Unremarkable orbits. Other: None. IMPRESSION: 1. Motion degraded exam. 2. No evidence of acute intracranial abnormality. 3. Cerebral atrophy and chronic small vessel ischemic disease.  Electronically Signed   By: Sebastian Ache M.D.   On: 06/28/2023 17:08   DG Chest Portable 1 View Result Date: 06/28/2023 CLINICAL DATA:  Altered mental status EXAM: PORTABLE CHEST 1 VIEW COMPARISON:  X-ray 04/09/2023 FINDINGS: Hyperinflation. No consolidation, pneumothorax or effusion. No edema. Normal cardiopericardial silhouette with a tortuous ectatic aorta. Overlapping cardiac leads. Degenerative changes of the spine and shoulders. IMPRESSION: Hyperinflation. No acute cardiopulmonary disease. Tortuous ectatic aorta. Electronically Signed   By: Karen Kays M.D.   On: 06/28/2023 12:24     Signed: Carmina Miller, DO  Internal Medicine Resident, PGY-1 [facility]  Pager: [phone] 3:43 PM, 07/02/2023

## 2023-07-03 DIAGNOSIS — G9341 Metabolic encephalopathy: Secondary | ICD-10-CM | POA: Diagnosis not present

## 2023-07-03 DIAGNOSIS — E870 Hyperosmolality and hypernatremia: Secondary | ICD-10-CM | POA: Diagnosis not present

## 2023-07-03 LAB — BASIC METABOLIC PANEL
Anion gap: 9 (ref 5–15)
BUN: 5 mg/dL — ABNORMAL LOW (ref 8–23)
CO2: 19 mmol/L — ABNORMAL LOW (ref 22–32)
Calcium: 7.8 mg/dL — ABNORMAL LOW (ref 8.9–10.3)
Chloride: 108 mmol/L (ref 98–111)
Creatinine, Ser: 0.56 mg/dL (ref 0.44–1.00)
GFR, Estimated: >60 mL/min (ref >60)
Glucose, Bld: 96 mg/dL (ref 70–99)
Potassium: 4.4 mmol/L (ref 3.5–5.1)
Sodium: 136 mmol/L (ref 135–145)

## 2023-07-03 LAB — CULTURE, BLOOD (ROUTINE X 2)
Culture: NO GROWTH
Culture: NO GROWTH
Special Requests: ADEQUATE
Special Requests: ADEQUATE

## 2023-07-03 MED ORDER — BISACODYL 10 MG RE SUPP
10.0000 mg | Freq: Once | RECTAL | Status: AC
  Administered 2023-07-03: 10 mg via RECTAL
  Filled 2023-07-03: qty 1

## 2023-07-03 MED ORDER — ENSURE ENLIVE PO LIQD
237.0000 mL | Freq: Two times a day (BID) | ORAL | Status: DC
  Administered 2023-07-03: 237 mL via ORAL

## 2023-07-03 MED ORDER — ORAL CARE MOUTH RINSE: 15.0000 mL | OROMUCOSAL | Status: DC | PRN

## 2023-07-03 MED ORDER — ORAL CARE MOUTH RINSE
15.0000 mL | OROMUCOSAL | Status: DC
  Administered 2023-07-03 – 2023-07-04 (×4): 15 mL via OROMUCOSAL

## 2023-07-03 NOTE — Progress Notes (Signed)
                 Subjective Mentation stable from yesterday. Briefly opens eyes. Non-interactive  Physical exam Blood pressure (!) 91/45, pulse 67, temperature 98.5 F (36.9 C), temperature source Oral, resp. rate 18, height 5\' 5"  (1.651 m), weight 63.5 kg, SpO2 100%.  Constitutional: somnolent, lying in bed, NAD Cardiovascular: regular rate and rhythm, no m/r/g, no LEE Pulmonary/Chest: normal work of breathing on room air, lungs clear to anterior auscultation bilaterally Abdominal: non-distended, soft, no apparent TTP Neurological: opens eyes briefly but not on command, withdraws to painful stimuli Skin: warm and dry  Weight change:    Intake/Output Summary (Last 24 hours) at 07/03/2023 0604 Last data filed at 07/02/2023 2154 Gross per 24 hour  Intake 1503.42 ml  Output 650 ml  Net 853.42 ml   Net IO Since Admission: 2,232.31 mL [07/03/23 0604]  Labs, images, and other studies    Latest Ref Rng & Units 06/29/2023    4:23 AM 06/28/2023    7:00 PM 06/28/2023   12:00 PM  CBC  WBC 4.0 - 10.5 K/uL 5.8  5.9  6.4   Hemoglobin 12.0 - 15.0 g/dL 40.9  81.1  91.4   Hematocrit 36.0 - 46.0 % 41.5  44.7  52.2   Platelets 150 - 400 K/uL 92  98  123        Latest Ref Rng & Units 07/02/2023    5:56 AM 07/01/2023    4:35 AM 06/30/2023    4:14 AM  BMP  Glucose 70 - 99 mg/dL 782  956  213   BUN 8 - 23 mg/dL 8  15  19    Creatinine 0.44 - 1.00 mg/dL 0.86  5.78  4.69   Sodium 135 - 145 mmol/L 139  140  143   Potassium 3.5 - 5.1 mmol/L 2.9  3.5  3.1   Chloride 98 - 111 mmol/L 110  112  115   CO2 22 - 32 mmol/L 21  21  23    Calcium 8.9 - 10.3 mg/dL 7.5  7.8  7.7      Assessment and plan Hospital day 4  Abigail Thomas is a 78 y.o.female with advanced dementia and cerebral palsy who presents to ED 1/27 via EMS with decreased mentation and admitted for acute metabolic encephalopathy with associated hypernatremia.   #Acute Metabolic Encephalopathy #Hx of Advanced  Dementia #Hypernatremia #Hypokalemia Mentation stable over the past few days but still non-interactive. Na+ 136 and. K+ 4.4. We will d/c fluids today now that diet has been advanced. Both palliative and primary team had extensive discussion with family regarding overall prognosis. Son, [name], is agreeable with home hospice but is certainly hopeful she gets back to baseline mental status.  Referral has been placed for [facility] with anticipated discharge tomorrow. - Dysphagia I diet - Ensure BID -Trend BMP - Delirium precautions     Diet: Dysphagia I VTE: enoxaparin (LOVENOX) injection 40 mg Start: 06/28/23 1700  Code: DNR   Discharge plan: [facility] tomorrow  [place], DO 07/03/2023, 6:04 AM  Pager: [phone] After 5pm or weekend: [phone]

## 2023-07-03 NOTE — Progress Notes (Signed)
[facility] [phone] [facility] hospital liaison note  Patient is referred to hospice services at home upon discharge. Talked with family member by phone to explain services and hospice philosophy of care.   Current plan is for patient to discharge Sunday with nursing visit scheduled for Tuesday evening.   There are no immediate DME needs.   Please send signed and completed DNR with patient/family. Please provide prescriptions at discharge as needed for ongoing symptom management.   Please call with any hospice related questions or concerns.   Thank you for the opportunity to participate in this patient's care.  Thea Gist, Charity fundraiser, BSN [employer] [phone]

## 2023-07-04 DIAGNOSIS — G9341 Metabolic encephalopathy: Secondary | ICD-10-CM | POA: Diagnosis not present

## 2023-07-04 DIAGNOSIS — Z515 Encounter for palliative care: Secondary | ICD-10-CM

## 2023-07-04 LAB — BASIC METABOLIC PANEL
Anion gap: 6 (ref 5–15)
BUN: <5 mg/dL — ABNORMAL LOW (ref 8–23)
CO2: 24 mmol/L (ref 22–32)
Calcium: 8 mg/dL — ABNORMAL LOW (ref 8.9–10.3)
Chloride: 107 mmol/L (ref 98–111)
Creatinine, Ser: 0.63 mg/dL (ref 0.44–1.00)
GFR, Estimated: >60 mL/min (ref >60)
Glucose, Bld: 101 mg/dL — ABNORMAL HIGH (ref 70–99)
Potassium: 4.6 mmol/L (ref 3.5–5.1)
Sodium: 137 mmol/L (ref 135–145)

## 2023-07-04 MED ORDER — MORPHINE SULFATE 20 MG/5ML PO SOLN: 2.5000 mg | ORAL | 0 refills | Status: AC | PRN

## 2023-07-04 MED ORDER — GERHARDT'S BUTT CREAM
TOPICAL_CREAM | Freq: Two times a day (BID) | CUTANEOUS | Status: DC
  Filled 2023-07-04: qty 60

## 2023-07-04 MED ORDER — LORAZEPAM 2 MG/ML PO CONC: 1.0000 mg | ORAL | 0 refills | Status: AC | PRN

## 2023-07-04 NOTE — TOC Transition Note (Signed)
Transition of Care [facility]) - Discharge Note   Patient Details  Name: Abigail Thomas MRN: [id number] Date of Birth: 01-14-1946  Transition [facility] Care [facility]) CM/SW Contact:  Ronny Bacon, RN Phone Number: 07/04/2023, 10:54 AM   Clinical Narrative:   Patient is being discharged today. Spoke with son and verified her address. PTAR forms completed and sent to the unit with request to add DNR form. Call to PTAR to arrange transportation.     Final next level of care: Home w Hospice Care Barriers to Discharge: No Barriers Identified   Patient Goals and CMS Choice            Discharge Placement                       Discharge Plan and Services Additional resources added to the After Visit Summary for                                       Social Drivers of Health (SDOH) Interventions SDOH Screenings   Food Insecurity: Patient Unable To Answer (06/29/2023)  Housing: Patient Unable To Answer (06/29/2023)  Recent Concern: Housing - High Risk (04/09/2023)  Transportation Needs: Patient Unable To Answer (06/29/2023)  Utilities: Patient Unable To Answer (06/29/2023)  Social Connections: Patient Unable To Answer (06/29/2023)  Tobacco Use: Unknown (06/28/2023)     Readmission Risk Interventions    07/02/2023    2:51 PM  Readmission Risk Prevention Plan  Post Dischage Appt Complete  Medication Screening Complete  Transportation Screening Complete

## 2023-07-04 NOTE — Progress Notes (Signed)
Patient discharged to home, AVS reviewed with family. Son is aware patient has sacral wound, wound care education provided, son verbalized understanding. Supplies sent with the patient. PTAR provided transportation.

## 2023-07-04 NOTE — Discharge Instructions (Addendum)
Mr. Abigail Thomas -  It was our pleasure to care for your mother. We appreciate the good care you have taken of her over the past few years. We are glad you were able to work with our palliative and hospice services as she transitions to go home. I hope you spend quality time with her and help her stay comfortable. Please communicate frequently with [facility] as they will be able to help you monitor her symptoms of distress as part of her hospice care.  In the meantime, I have sent new medications to you [facility] pharmacy until the hospice nurse comes and sees her at your home:  - Morphine Concentrate 0.57ml sublingual every 2 hour as needed for pain or shortness of breath - Lorazepam 0.69ml sublingual every 4 hours as needed for anxiety  The nurse is also sending with her some barrier cream and padding to help with the pressure ulcers around her buttocks area to keep her comfortable.  We wish you the best, Your internal medicine team at [facility]

## 2023-07-04 NOTE — Discharge Summary (Addendum)
Name: Abigail Thomas MRN: [id number] DOB: 03-16-1946 78 y.o. PCP: Marletta Lor, NP  Date of Admission: 06/28/2023 11:39 AM Date of Discharge: 07/04/2023 10:25 AM Attending Physician: Dr. Ninetta Lights  Discharge Diagnosis: Principal Problem:   Acute metabolic encephalopathy Active Problems:   Severe dementia (HCC)   Pressure injury of skin   Hypernatremia   Acute encephalopathy   Hyponatremia   Delirium   Hospice care Abigail Thomas   Palliative care Abigail Thomas    Discharge Medications: Allergies as of 07/04/2023   No Known Allergies      Medication List     STOP taking these medications    cholecalciferol 25 MCG (1000 UNIT) tablet Commonly known as: VITAMIN D3   fluconazole 100 MG tablet Commonly known as: DIFLUCAN   nitrofurantoin 100 MG capsule Commonly known as: MACRODANTIN       TAKE these medications    aspirin-acetaminophen-caffeine 250-250-65 MG tablet Commonly known as: EXCEDRIN MIGRAINE Take 1 tablet by mouth every 6 (six) hours as needed for headache.   COLACE PO Take 1 tablet by mouth daily.   LORazepam 2 MG/ML concentrated solution Commonly known as: ATIVAN Take 0.5 mLs (1 mg total) by mouth every 4 (four) hours as needed for anxiety.   morphine 20 MG/5ML solution Take 0.6 mLs (2.4 mg total) by mouth every 2 (two) hours as needed for pain.               Discharge Care Instructions  (From admission, onward)           Start     Ordered   07/04/23 0000  Discharge wound care:       Comments: Pressure Injury 06/29/23 Sacrum Stage 1 -  Intact skin with non-blanchable redness of a localized area usually over a bony prominence.   Pressure Injury 07/03/23 Buttocks Left Stage 2 -  Partial thickness loss of dermis presenting as a shallow open injury with a red, pink wound bed without slough.    Pressure Injury 07/03/23 Sacrum Lower;Medial Stage 2 -  Partial thickness loss of dermis presenting as a shallow open injury with a red, pink wound bed without  slough.   Keep dry and clean. Apply barrier cream and turn often as able.   07/04/23 1025            Disposition and follow-up:   Abigail Thomas was discharged from [facility] in [place] condition.  At the hospital follow up visit please address:  1.  Follow-up:  *Hospice care -Abigail Thomas's [facility] RN to visit on 07/06/2023 -No DME needs identified -Son will be home and pick up medications for PRN pain, dyspnea and anxiety management -[facility] to assume medication management with RN -DNR signed    2.  Labs / imaging needed at time of follow-up: none  3.  Pending labs/ test needing follow-up: none  4.  Medication Changes    ADDED  - Morphine solution for PRN pain and dyspnea managament  - Lorazepam solution for PRN anxiety management    Follow-up Appointments: [facility] nurse to see Abigail Thomas at home on 07/06/2023  Hospital Course by problem list: Hospice care Acute Encephalopathy Hypernatremia, resolved Alzheimer's dementia history Abigail Thomas somnolent on admission so history obtained from phone call with son, Tinnie Thomas. At baseline, Abigail Thomas is unable to walk but is able to feed herself. Abigail Thomas's mentation began worsening 6 days prior to admission which prompted PCP visit. Initially treated for UTI, with worsening which prompted this admission. During this hospitalization, Abigail Thomas  was noted to be hypernatremic to 162.  LA 2.6 > 2.3 > 1.8.  CXR unremarkable. CT Head showed no evidence of bleed. Hypernatremia was managed and resolved by hospital day 3 and thought to be in the case of advancing severe alzheimer's disease with poor PO intake. Subsequent UA unremarkable and Rocephin discontinued after day 3. Given progression of disease, palliative care was consulted; after at length discussion, family made decision to discharge home with hospice. [facility] service was engaged during this hospitalization and will aid family to transition home. No DME needs  identified. Abigail Thomas's son agreed to perform ADLs and round the clock care, which he had been performing prior to hospitalization. Short course of PO lorazepam and morphine solutions sent to preferred pharmacy until [facility] assumes care. Abigail Thomas's family in agreement. Case management will arrange ambulance transport from hospital  Discharge Subjective: Abigail Thomas aware and responding intermittently to questions. In no apparent distress.   Discharge Exam:   Blood pressure (!) 114/39, pulse 61, temperature 98.5 F (36.9 C), temperature source Oral, resp. rate 18, height 5\' 5"  (1.651 m), weight 63.5 kg, SpO2 99%.  Constitutional:chronically ill-appearing laying in hed, in no acute distress HENT: normocephalic atraumatic, mucous membranes moist Cardiovascular: regular rate and rhythm, no m/r/g, No JVD Pulmonary/Chest: normal work of breathing on room air, lungs clear to auscultation bilaterally. nocrackles  Abdominal: soft, non-tender, non-distended.  Neurological: alert but not engaging in conversation MSK: no gross abnormalities. No pitting edema. Contracted lower extremities Skin: warm and dry Calm mood.   Pertinent Labs, Studies, and Procedures:     Latest Ref Rng & Units 06/29/2023    4:23 AM 06/28/2023    7:00 PM 06/28/2023   12:00 PM  CBC  WBC 4.0 - 10.5 K/uL 5.8  5.9  6.4   Hemoglobin 12.0 - 15.0 g/dL 40.9  81.1  91.4   Hematocrit 36.0 - 46.0 % 41.5  44.7  52.2   Platelets 150 - 400 K/uL 92  98  123        Latest Ref Rng & Units 07/04/2023    6:06 AM 07/03/2023    5:18 AM 07/02/2023    5:56 AM  CMP  Glucose 70 - 99 mg/dL 782  96  956   BUN 8 - 23 mg/dL 5  5  8    Creatinine 0.44 - 1.00 mg/dL 2.13  0.86  5.78   Sodium 135 - 145 mmol/L 137  136  139   Potassium 3.5 - 5.1 mmol/L 4.6  4.4  2.9   Chloride 98 - 111 mmol/L 107  108  110   CO2 22 - 32 mmol/L 24  19  21    Calcium 8.9 - 10.3 mg/dL 8.0  7.8  7.5     DG Ankle Complete Left Result Date: 06/28/2023 CLINICAL DATA:  Pain  and swelling, initial encounter EXAM: LEFT ANKLE COMPLETE - 3+ VIEW COMPARISON:  None Available. FINDINGS: Lateral soft tissue swelling is noted. No underlying fracture or dislocation is noted. Vascular calcifications are seen. IMPRESSION: Soft tissue swelling laterally without acute bony abnormality. Electronically Signed   By: Alcide Clever M.D.   On: 06/28/2023 21:21   CT Head Wo Contrast Result Date: 06/28/2023 CLINICAL DATA:  Mental status change, unknown cause. EXAM: CT HEAD WITHOUT CONTRAST TECHNIQUE: Contiguous axial images were obtained from the base of the skull through the vertex without intravenous contrast. RADIATION DOSE REDUCTION: This exam was performed according to the departmental dose-optimization program which includes automated exposure control, adjustment of  the mA and/or kV according to Abigail Thomas size and/or use of iterative reconstruction technique. COMPARISON:  Head CT 04/09/2023 FINDINGS: The study is moderately motion degraded. Brain: There is no evidence of an acute infarct, intracranial hemorrhage, mass, midline shift, or extra-axial fluid collection. There is moderate cerebral atrophy. Patchy cerebral white matter hypodensities are nonspecific but compatible with mild-to-moderate chronic small vessel ischemic disease. Vascular: No hyperdense vessel. Skull: No acute fracture or suspicious osseous lesion. Sinuses/Orbits: The visualized paranasal sinuses and mastoid air cells are clear. Unremarkable orbits. Other: None. IMPRESSION: 1. Motion degraded exam. 2. No evidence of acute intracranial abnormality. 3. Cerebral atrophy and chronic small vessel ischemic disease. Electronically Signed   By: Sebastian Ache M.D.   On: 06/28/2023 17:08   DG Chest Portable 1 View Result Date: 06/28/2023 CLINICAL DATA:  Altered mental status EXAM: PORTABLE CHEST 1 VIEW COMPARISON:  X-ray 04/09/2023 FINDINGS: Hyperinflation. No consolidation, pneumothorax or effusion. No edema. Normal cardiopericardial  silhouette with a tortuous ectatic aorta. Overlapping cardiac leads. Degenerative changes of the spine and shoulders. IMPRESSION: Hyperinflation. No acute cardiopulmonary disease. Tortuous ectatic aorta. Electronically Signed   By: Karen Kays M.D.   On: 06/28/2023 12:24     Discharge Instructions: Discharge Instructions     Diet - low sodium heart healthy   Complete by: As directed    Discharge instructions   Complete by: As directed    Abigail Thomas -  It was our pleasure to care for your mother. We appreciate the good care you have taken of her over the past few years. We are glad you were able to work with our palliative and hospice services as she transitions to go home. I hope you spend quality time with her and help her stay comfortable. Please communicate frequently with [facility] as they will be able to help you monitor her symptoms of distress as part of her hospice care.  In the meantime, I have sent new medications to you [facility] pharmacy until the hospice nurse comes and sees her at your home:   - Morphine Concentrate 0.2ml sublingual every 2 hour as needed for pain or shortness of breath  - Lorazepam 0.56ml sublingual every 4 hours as needed for anxiety  The nurse is also sending with her some barrier cream and padding to help with the pressure ulcers around her buttocks area to keep her comfortable.  We wish you the best, Your internal medicine team at [facility]   Discharge wound care:   Complete by: As directed    Pressure Injury 06/29/23 Sacrum Stage 1 -  Intact skin with non-blanchable redness of a localized area usually over a bony prominence.   Pressure Injury 07/03/23 Buttocks Left Stage 2 -  Partial thickness loss of dermis presenting as a shallow open injury with a red, pink wound bed without slough.    Pressure Injury 07/03/23 Sacrum Lower;Medial Stage 2 -  Partial thickness loss of dermis presenting as a shallow open injury with a red, pink wound bed without  slough.   Keep dry and clean. Apply barrier cream and turn often as able.   Increase activity slowly   Complete by: As directed        Signed: Morene Crocker, MD [facility] - PGY2 Pager: [phone] 07/04/2023, 10:25 AM    Please contact the on call pager after 5 pm and on weekends at [phone].

## 2023-07-31 DEATH — deceased
